# Patient Record
Sex: Male | Born: 1980 | Race: Black or African American | Hispanic: No | Marital: Single | Smoking: Never smoker
Health system: Southern US, Community
[De-identification: ages and names within clinical notes are randomized; demographics above are authoritative.]

## PROBLEM LIST (undated history)

## (undated) DIAGNOSIS — I10 Essential (primary) hypertension: Secondary | ICD-10-CM

## (undated) DIAGNOSIS — E119 Type 2 diabetes mellitus without complications: Secondary | ICD-10-CM

---

## 2009-10-17 ENCOUNTER — Emergency Department (HOSPITAL_COMMUNITY): Admission: EM | Admit: 2009-10-17 | Discharge: 2009-10-17 | Payer: Self-pay | Admitting: Emergency Medicine

## 2009-11-15 ENCOUNTER — Emergency Department (HOSPITAL_COMMUNITY): Admission: EM | Admit: 2009-11-15 | Discharge: 2009-11-15 | Payer: Self-pay | Admitting: Emergency Medicine

## 2010-12-31 IMAGING — CR DG CHEST 2V
2 series · 2 of 2 positions shown · non-contrast
Comparison: None.

CLINICAL DATA: Cough, congestion and abdominal pain.

CHEST - 2 VIEW

[view not recorded (1 of 2)]
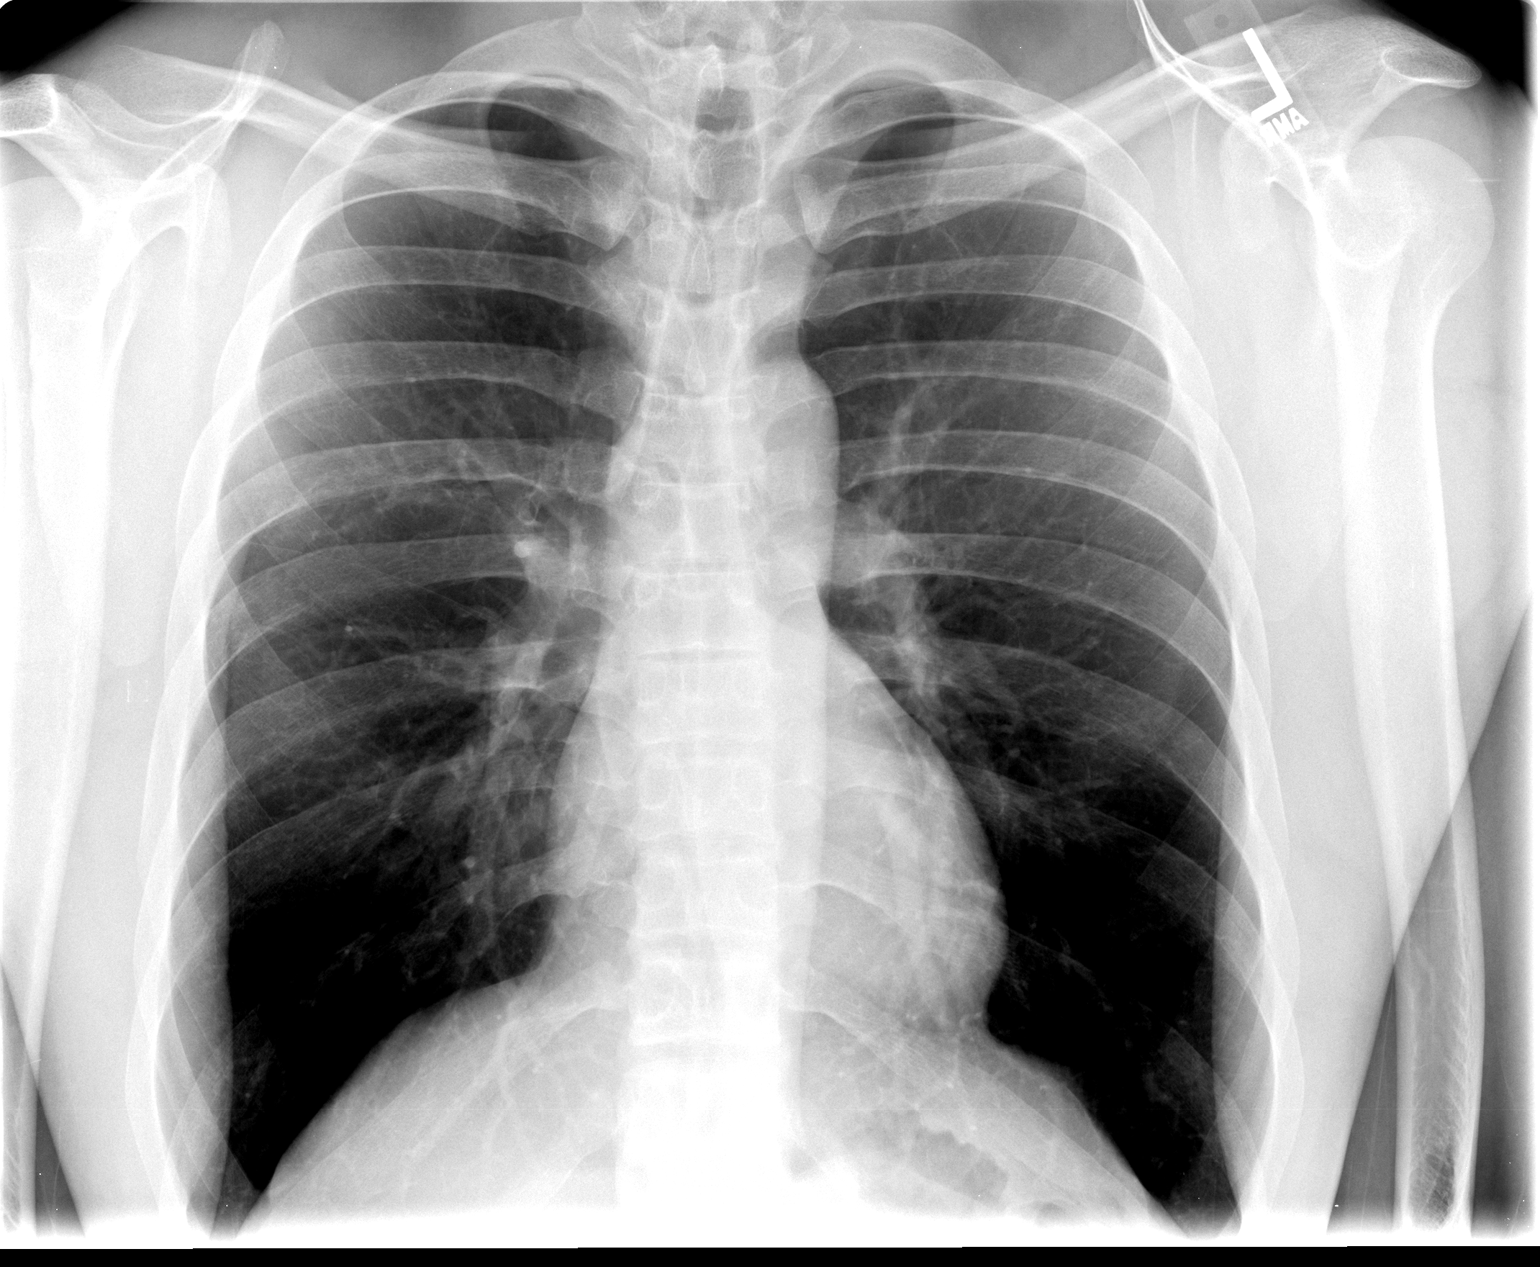

[view not recorded (2 of 2)]
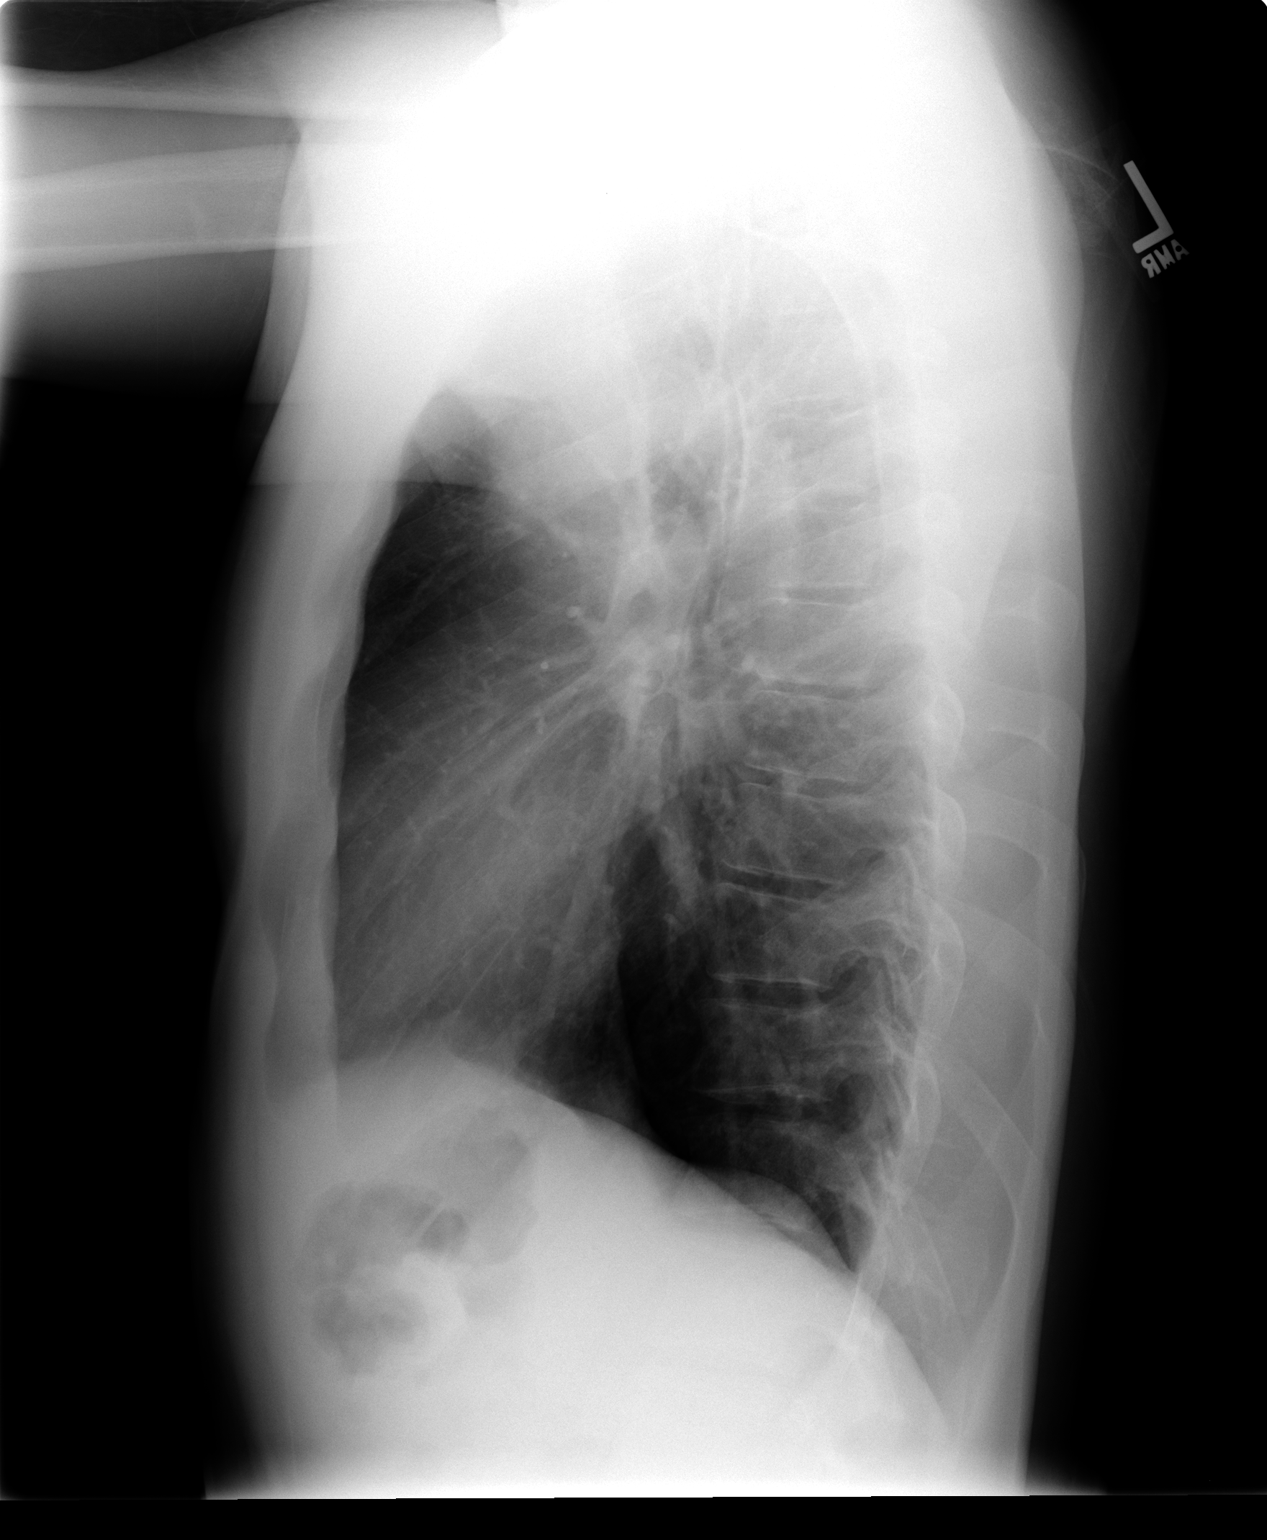

[2 of 2 positions shown; findings below may reference images not displayed]

FINDINGS: Trachea is midline.  Heart size normal.  Lungs are clear.
No pleural fluid.
IMPRESSION: No acute findings.

## 2011-12-19 ENCOUNTER — Emergency Department (HOSPITAL_COMMUNITY)
Admission: EM | Admit: 2011-12-19 | Discharge: 2011-12-19 | Disposition: A | Discharge status: Home / Self Care | Payer: Self-pay | Attending: Emergency Medicine | Admitting: Emergency Medicine

## 2011-12-19 ENCOUNTER — Encounter (HOSPITAL_COMMUNITY): Payer: Self-pay | Admitting: *Deleted

## 2011-12-19 DIAGNOSIS — K089 Disorder of teeth and supporting structures, unspecified: Secondary | ICD-10-CM | POA: Insufficient documentation

## 2011-12-19 DIAGNOSIS — K0889 Other specified disorders of teeth and supporting structures: Secondary | ICD-10-CM

## 2011-12-19 DIAGNOSIS — K029 Dental caries, unspecified: Secondary | ICD-10-CM | POA: Insufficient documentation

## 2011-12-19 DIAGNOSIS — I10 Essential (primary) hypertension: Secondary | ICD-10-CM | POA: Insufficient documentation

## 2011-12-19 HISTORY — DX: Essential (primary) hypertension: I10

## 2011-12-19 MED ORDER — HYDROCODONE-ACETAMINOPHEN 5-325 MG PO TABS
1.0000 | ORAL_TABLET | Freq: Once | ORAL | Status: AC
  Administered 2011-12-19: 1 via ORAL
  Filled 2011-12-19: qty 1

## 2011-12-19 MED ORDER — IBUPROFEN 800 MG PO TABS
800.0000 mg | ORAL_TABLET | Freq: Once | ORAL | Status: AC
  Administered 2011-12-19: 800 mg via ORAL
  Filled 2011-12-19: qty 1

## 2011-12-19 MED ORDER — HYDROCODONE-ACETAMINOPHEN 5-325 MG PO TABS: ORAL_TABLET | ORAL | Status: DC

## 2011-12-19 MED ORDER — CLINDAMYCIN HCL 150 MG PO CAPS: 300.0000 mg | ORAL_CAPSULE | Freq: Three times a day (TID) | ORAL | Status: AC

## 2011-12-19 MED ORDER — CLINDAMYCIN HCL 150 MG PO CAPS
300.0000 mg | ORAL_CAPSULE | Freq: Once | ORAL | Status: AC
  Administered 2011-12-19: 300 mg via ORAL
  Filled 2011-12-19: qty 2

## 2011-12-19 NOTE — ED Provider Notes (Signed)
History     CSN: [id number]  Arrival date & time 12/19/11  1055   First MD Initiated Contact with Patient 12/19/11 1105      Chief Complaint  Patient presents with  . Dental Pain    (Consider location/radiation/quality/duration/timing/severity/associated sxs/prior treatment) HPI Comments: Plans to go to see his girlfriend's dentist for extraction.  Patient is a 31 y.o. male presenting with tooth pain. The history is provided by the patient. No language interpreter was used.  Dental PainThe primary symptoms include mouth pain. Primary symptoms do not include dental injury or fever. The symptoms began 2 days ago. The symptoms are unchanged. The symptoms occur constantly.  Additional symptoms include: jaw pain. Additional symptoms do not include: facial swelling.    Past Medical History  Diagnosis Date  . Hypertension     History reviewed. No pertinent past surgical history.  No family history on file.  History  Substance Use Topics  . Smoking status: Never Smoker   . Smokeless tobacco: Not on file  . Alcohol Use: No      Review of Systems  Constitutional: Negative for fever.  HENT: Positive for dental problem. Negative for facial swelling.   All other systems reviewed and are negative.    Allergies  Penicillins  Home Medications  No current outpatient prescriptions on file.  BP 148/100  Pulse 96  Temp 98.7 F (37.1 C)  Resp 18  Ht 5\' 6"  (1.676 m)  Wt 168 lb (76.204 kg)  BMI 27.12 kg/m2  SpO2 99%  Physical Exam  Nursing note and vitals reviewed. Constitutional: He is oriented to person, place, and time. He appears well-developed and well-nourished.  HENT:  Head: Normocephalic and atraumatic.  Mouth/Throat: Uvula is midline and mucous membranes are normal. Normal dentition. Dental caries present. No dental abscesses or uvula swelling. No oropharyngeal exudate, posterior oropharyngeal edema, posterior oropharyngeal erythema or tonsillar abscesses.     Eyes: EOM are normal.  Neck: Normal range of motion.  Cardiovascular: Normal rate, regular rhythm, normal heart sounds and intact distal pulses.   Pulmonary/Chest: Effort normal and breath sounds normal. No respiratory distress.  Abdominal: Soft. He exhibits no distension. There is no tenderness.  Musculoskeletal: Normal range of motion.  Neurological: He is alert and oriented to person, place, and time.  Skin: Skin is warm and dry.  Psychiatric: He has a normal mood and affect. Judgment normal.    ED Course  Procedures (including critical care time)  Labs Reviewed - No data to display No results found.   No diagnosis found.    MDM          Worthy Rancher, PA 12/19/11 [phone]

## 2011-12-19 NOTE — Discharge Instructions (Signed)
Dental Pain Toothache is pain in or around a tooth. It may get worse with chewing or with cold or heat.  HOME CARE  Your dentist may use a numbing medicine during treatment. If so, you may need to avoid eating until the medicine wears off. Ask your dentist about this.   Only take medicine as told by your dentist or doctor.   Avoid chewing food near the painful tooth until after all treatment is done. Ask your dentist about this.  GET HELP RIGHT AWAY IF:   The problem gets worse or new problems appear.   You have a fever.   There is redness and puffiness (swelling) of the face, jaw, or neck.   You cannot open your mouth.   There is pain in the jaw.   There is very bad pain that is not helped by medicine.  MAKE SURE YOU:   Understand these instructions.   Will watch your condition.   Will get help right away if you are not doing well or get worse.  Document Released: 04/07/2008 Document Revised: 07/02/2011 Document Reviewed: 04/07/2008 [facility] Patient Information 2012 [place], [place].   Take the meds as directed.  Take ibuprofen up to 800 mg every 8 hrs with food.  Follow up with the dentist as planned.

## 2011-12-19 NOTE — ED Notes (Signed)
Pt given resource handouts for free clinic and dental resource sheet for [employer]

## 2011-12-19 NOTE — ED Notes (Signed)
Pt also states that he wants to be checked to see if he has asthma, needs referral to pcp,

## 2011-12-19 NOTE — ED Notes (Signed)
Left lower dental pain, swelling to gum area, started 4-5 days ago,

## 2011-12-20 NOTE — ED Provider Notes (Signed)
Evaluation and management procedures were performed by the PA/NP under my supervision/collaboration.   Temica Righetti D Azan Maneri, MD 12/20/11 0820 

## 2015-07-16 ENCOUNTER — Encounter (HOSPITAL_COMMUNITY): Payer: Self-pay | Admitting: Emergency Medicine

## 2015-07-16 ENCOUNTER — Emergency Department (HOSPITAL_COMMUNITY)
Admission: EM | Admit: 2015-07-16 | Discharge: 2015-07-16 | Disposition: A | Discharge status: Home / Self Care | Payer: Self-pay | Attending: Emergency Medicine | Admitting: Emergency Medicine

## 2015-07-16 DIAGNOSIS — I10 Essential (primary) hypertension: Secondary | ICD-10-CM | POA: Insufficient documentation

## 2015-07-16 DIAGNOSIS — K0889 Other specified disorders of teeth and supporting structures: Secondary | ICD-10-CM

## 2015-07-16 DIAGNOSIS — K088 Other specified disorders of teeth and supporting structures: Secondary | ICD-10-CM | POA: Insufficient documentation

## 2015-07-16 DIAGNOSIS — K029 Dental caries, unspecified: Secondary | ICD-10-CM | POA: Insufficient documentation

## 2015-07-16 DIAGNOSIS — Z880 Allergy status to penicillin: Secondary | ICD-10-CM | POA: Insufficient documentation

## 2015-07-16 MED ORDER — CLINDAMYCIN HCL 150 MG PO CAPS: 300.0000 mg | ORAL_CAPSULE | Freq: Four times a day (QID) | ORAL | Status: DC

## 2015-07-16 MED ORDER — TRAMADOL HCL 50 MG PO TABS
50.0000 mg | ORAL_TABLET | Freq: Once | ORAL | Status: AC
  Administered 2015-07-16: 50 mg via ORAL
  Filled 2015-07-16: qty 1

## 2015-07-16 MED ORDER — CLINDAMYCIN HCL 150 MG PO CAPS
300.0000 mg | ORAL_CAPSULE | Freq: Once | ORAL | Status: AC
  Administered 2015-07-16: 300 mg via ORAL
  Filled 2015-07-16: qty 2

## 2015-07-16 MED ORDER — TRAMADOL HCL 50 MG PO TABS: 50.0000 mg | ORAL_TABLET | Freq: Four times a day (QID) | ORAL | Status: DC | PRN

## 2015-07-16 NOTE — ED Notes (Signed)
Patient given discharge instruction, verbalized understand. Patient ambulatory out of the department.  

## 2015-07-16 NOTE — Discharge Instructions (Signed)
Dental Pain  Toothache is pain in or around a tooth. It may get worse with chewing or with cold or heat.   HOME CARE  · Your dentist may use a numbing medicine during treatment. If so, you may need to avoid eating until the medicine wears off. Ask your dentist about this.  · Only take medicine as told by your dentist or doctor.  · Avoid chewing food near the painful tooth until after all treatment is done. Ask your dentist about this.  GET HELP RIGHT AWAY IF:   · The problem gets worse or new problems appear.  · You have a fever.  · There is redness and puffiness (swelling) of the face, jaw, or neck.  · You cannot open your mouth.  · There is pain in the jaw.  · There is very bad pain that is not helped by medicine.  MAKE SURE YOU:   · Understand these instructions.  · Will watch your condition.  · Will get help right away if you are not doing well or get worse.  Document Released: 04/07/2008 Document Revised: 01/12/2012 Document Reviewed: 04/07/2008  [employer]® Patient Information ©2015 [employer]. This information is not intended to replace advice given to you by your health care provider. Make sure you discuss any questions you have with your health care provider.

## 2015-07-16 NOTE — ED Notes (Signed)
Started hurting lower lt jaw last week - pt states that gum is red and inflammed and he has some swelling to his jaw.-

## 2015-07-16 NOTE — ED Provider Notes (Signed)
CSN: [id number]     Arrival date & time 07/16/15  1458 History   First MD Initiated Contact with Patient 07/16/15 1621     Chief Complaint  Patient presents with  . Oral Swelling     (Consider location/radiation/quality/duration/timing/severity/associated sxs/prior Treatment) HPI  Edward Roman is a 34 y.o. male who presents to the Emergency Department complaining of left lower tooth pain for one week.  He describes a sharp throbbing pain to his left lower jaw that radiates to the left ear and face.  Pain is worse with cold exposure and chewing.  He also c/o redness and swelling along the gums of the left lower molar.  He has tried OTC analgesics without relief.  He denies difficulty swallowing, neck pain, fever and facial swelling.  He has not tried to arrange a dental visit.     Past Medical History  Diagnosis Date  . Hypertension    History reviewed. No pertinent past surgical history. Family History  Problem Relation Age of Onset  . Hypertension Mother   . Diabetes Mother   . Hypertension Father   . Diabetes Father   . Hypertension Other   . Diabetes Other   . Thyroid disease Other    Social History  Substance Use Topics  . Smoking status: Never Smoker   . Smokeless tobacco: None  . Alcohol Use: No    Review of Systems  Constitutional: Negative for fever and appetite change.  HENT: Positive for dental problem. Negative for congestion, facial swelling, sore throat and trouble swallowing.   Eyes: Negative for pain and visual disturbance.  Musculoskeletal: Negative for neck pain and neck stiffness.  Neurological: Negative for dizziness, facial asymmetry and headaches.  Hematological: Negative for adenopathy.  All other systems reviewed and are negative.     Allergies  Penicillins  Home Medications   Prior to Admission medications   Medication Sig Start Date End Date Taking? Authorizing Provider  HYDROcodone-acetaminophen (NORCO) 5-325 MG per tablet One tab  po q 4-6 hrs prn pain 12/19/11   Richard Paul Half, PA-C   BP 161/98 mmHg  Pulse 68  Temp(Src) 98.1 F (36.7 C) (Oral)  Resp 16  Ht  (1.626 m)  Wt 177 lb (80.287 kg)  BMI 30.37 kg/m2  SpO2 100% Physical Exam  Constitutional: He is oriented to person, place, and time. He appears well-developed and well-nourished. No distress.  HENT:  Head: Normocephalic and atraumatic.  Right Ear: Tympanic membrane and ear canal normal.  Left Ear: Tympanic membrane and ear canal normal.  Mouth/Throat: Uvula is midline, oropharynx is clear and moist and mucous membranes are normal. No trismus in the jaw. Dental caries present. No dental abscesses or uvula swelling.  Tenderness of the left lower third molar with edema and erythema of the surrounding gums.  No facial swelling, obvious dental abscess, trismus, or sublingual abnml.    Neck: Normal range of motion. Neck supple.  Cardiovascular: Normal rate, regular rhythm and normal heart sounds.   No murmur heard. Pulmonary/Chest: Effort normal and breath sounds normal.  Musculoskeletal: Normal range of motion.  Lymphadenopathy:    He has no cervical adenopathy.  Neurological: He is alert and oriented to person, place, and time. He exhibits normal muscle tone. Coordination normal.  Skin: Skin is warm and dry.  Nursing note and vitals reviewed.   ED Course  Procedures (including critical care time) Labs Review Labs Reviewed - No data to display    MDM   Final diagnoses:  Pain, dental    Pt is well appearing.  Vitals stable , no concerning sx's for Ludwig's angina. Airway is patent.  Pt agrees to close f/u with a dentist.  Referral info given.  Patient stable for d/c and agrees to plan.    Pauline Aus, PA-C 07/17/15 1351  Devoria Albe, MD 07/17/15 2302

## 2016-10-02 ENCOUNTER — Ambulatory Visit: Payer: Self-pay | Admitting: Pharmacy Technician

## 2016-10-02 NOTE — Progress Notes (Signed)
Patient scheduled for eligibility appointment at [facility].  Patient did not show for the appointment on 10/02/16 at 10:30a.m.  Patient did not reschedule eligibility appointment.  Sherilyn DacostaBetty J; Anyelina Claycomb Care Manager [facility]

## 2018-02-18 ENCOUNTER — Encounter: Payer: Self-pay | Admitting: Emergency Medicine

## 2018-02-18 ENCOUNTER — Other Ambulatory Visit: Payer: Self-pay

## 2018-02-18 ENCOUNTER — Emergency Department
Admission: EM | Admit: 2018-02-18 | Discharge: 2018-02-18 | Disposition: A | Discharge status: Home / Self Care | Payer: Self-pay | Attending: Emergency Medicine | Admitting: Emergency Medicine

## 2018-02-18 ENCOUNTER — Emergency Department: Payer: Self-pay

## 2018-02-18 DIAGNOSIS — X500XXA Overexertion from strenuous movement or load, initial encounter: Secondary | ICD-10-CM | POA: Insufficient documentation

## 2018-02-18 DIAGNOSIS — Y999 Unspecified external cause status: Secondary | ICD-10-CM | POA: Insufficient documentation

## 2018-02-18 DIAGNOSIS — I10 Essential (primary) hypertension: Secondary | ICD-10-CM | POA: Insufficient documentation

## 2018-02-18 DIAGNOSIS — S39011A Strain of muscle, fascia and tendon of abdomen, initial encounter: Secondary | ICD-10-CM | POA: Insufficient documentation

## 2018-02-18 DIAGNOSIS — Y9383 Activity, rough housing and horseplay: Secondary | ICD-10-CM | POA: Insufficient documentation

## 2018-02-18 DIAGNOSIS — Y929 Unspecified place or not applicable: Secondary | ICD-10-CM | POA: Insufficient documentation

## 2018-02-18 DIAGNOSIS — Z79899 Other long term (current) drug therapy: Secondary | ICD-10-CM | POA: Insufficient documentation

## 2018-02-18 MED ORDER — CYCLOBENZAPRINE HCL 10 MG PO TABS: 10.0000 mg | ORAL_TABLET | Freq: Three times a day (TID) | ORAL | 0 refills | Status: DC | PRN

## 2018-02-18 MED ORDER — IBUPROFEN 600 MG PO TABS: 600.0000 mg | ORAL_TABLET | Freq: Three times a day (TID) | ORAL | 0 refills | Status: DC | PRN

## 2018-02-18 NOTE — ED Provider Notes (Signed)
[facility] Emergency Department Provider Note   ____________________________________________   First MD Initiated Contact with Patient 02/18/18 1130     (approximate)  I have reviewed the triage vital signs and the nursing notes.   HISTORY  Chief Complaint Cough    HPI Edward Roman is a 37 y.o. male patient complain left upper abdominal pain which increased with coughing.  Patient states there is a history of increased exertion wrestling with his children in the past week.  Patient state pain in the abdomen increased with coughing.  Patient denies nausea, vomiting, diarrhea.  Patient denies constipation or bloody stools.  Patient denies flank pain.  Patient denies urinary complaints.  Past Medical History:  Diagnosis Date  . Hypertension     There are no active problems to display for this patient.   History reviewed. No pertinent surgical history.  Prior to Admission medications   Medication Sig Start Date End Date Taking? Authorizing Provider  clindamycin (CLEOCIN) 150 MG capsule Take 2 capsules (300 mg total) by mouth 4 (four) times daily. For 7 days 07/16/15   Triplett, Tammy, PA-C  cyclobenzaprine (FLEXERIL) 10 MG tablet Take 1 tablet (10 mg total) by mouth 3 (three) times daily as needed. 02/18/18   Joni Reining, PA-C  HYDROcodone-acetaminophen [facility]) 5-325 MG per tablet One tab po q 4-6 hrs prn pain 12/19/11   Worthy Rancher, PA-C  ibuprofen (ADVIL,MOTRIN) 600 MG tablet Take 1 tablet (600 mg total) by mouth every 8 (eight) hours as needed. 02/18/18   Joni Reining, PA-C  traMADol (ULTRAM) 50 MG tablet Take 1 tablet (50 mg total) by mouth every 6 (six) hours as needed. 07/16/15   Triplett, Tammy, PA-C    Allergies Penicillins  Family History  Problem Relation Age of Onset  . Hypertension Mother   . Diabetes Mother   . Hypertension Father   . Diabetes Father   . Hypertension Other   . Diabetes Other   . Thyroid disease Other       Social History Social History   Tobacco Use  . Smoking status: Never Smoker  . Smokeless tobacco: Never Used  Substance Use Topics  . Alcohol use: No  . Drug use: No    Review of Systems Constitutional: No fever/chills Eyes: No visual changes. ENT: No sore throat. Cardiovascular: Denies chest pain. Respiratory: Denies shortness of breath. Gastrointestinal: No abdominal pain.  No nausea, no vomiting.  No diarrhea.  No constipation. Genitourinary: Negative for dysuria. Musculoskeletal: Negative for back pain. Skin: Negative for rash. Neurological: Negative for headaches, focal weakness or numbness.   ____________________________________________   PHYSICAL EXAM:  VITAL SIGNS: ED Triage Vitals [02/18/18 1128]  Enc Vitals Group     BP      Pulse      Resp      Temp      Temp src      SpO2      Weight 177 lb (80.3 kg)     Height 5\' 6"  (1.676 m)     Head Circumference      Peak Flow      Pain Score 6     Pain Loc      Pain Edu?      Excl. in GC?     Constitutional: Alert and oriented. Well appearing and in no acute distress. Cardiovascular: Normal rate, regular rhythm. Grossly normal heart sounds.  Good peripheral circulation. Respiratory: Normal respiratory effort.  No retractions. Lungs CTAB.  Gastrointestinal: Soft, but ,moderate guarding with palpation.   No distention.  Abdominal bruits. No CVA tenderness. Skin:  Skin is warm, dry and intact. No rash noted. Psychiatric: Mood and affect are normal. Speech and behavior are normal.  ____________________________________________   LABS (all labs ordered are listed, but only abnormal results are displayed)  Labs Reviewed - No data to display ____________________________________________  EKG   ____________________________________________  RADIOLOGY    Official radiology report(s): [place] Abdomen Limited  Result Date: 02/18/2018 CLINICAL DATA:  Left upper abdominal pain EXAM: ULTRASOUND ABDOMEN  LIMITED: SPLEEN COMPARISON:  None. FINDINGS: Spleen appears normal in size and contour. Splenic length is 9.0 cm. No perisplenic fluid or adenopathy. IMPRESSION: Normal appearing spleen by ultrasound. Electronically Signed   By: Bretta BangWilliam  Woodruff III M.D.   On: 02/18/2018 12:24    ____________________________________________   PROCEDURES  Procedure(s) performed: None  Procedures  Critical Care performed: No  ____________________________________________   INITIAL IMPRESSION / ASSESSMENT AND PLAN / ED COURSE  As part of my medical decision making, I reviewed the following data within the electronic MEDICAL RECORD NUMBER    Left upper and mid abdominal pain.  Differential includes renal calculus,   splenic contusion, or muscle strain.  Patient ultrasound was unremarkable.  Patient given discharge care instructions and advised take medication as directed.  [facility]     ____________________________________________   FINAL CLINICAL IMPRESSION(S) / ED DIAGNOSES  Final diagnoses:  Abdominal muscle strain, initial encounter     ED Discharge Orders        Ordered    cyclobenzaprine (FLEXERIL) 10 MG tablet  3 times daily PRN     02/18/18 1250    ibuprofen (ADVIL,MOTRIN) 600 MG tablet  Every 8 hours PRN     02/18/18 1250       Note:  This document was prepared using Dragon voice recognition software and may include unintentional dictation errors.    Joni ReiningSmith, Marykathryn Carboni K, PA-C 02/18/18 1302    Darci CurrentBrown, Amanda N, MD 02/18/18 [phone]

## 2018-02-18 NOTE — ED Triage Notes (Signed)
When pt coughs feels a pain in left abdomen .  Had viral like illness couple weeks ago.

## 2018-05-20 ENCOUNTER — Encounter (HOSPITAL_COMMUNITY): Payer: Self-pay | Admitting: Emergency Medicine

## 2018-05-20 ENCOUNTER — Other Ambulatory Visit: Payer: Self-pay

## 2018-05-20 ENCOUNTER — Emergency Department (HOSPITAL_COMMUNITY)
Admission: EM | Admit: 2018-05-20 | Discharge: 2018-05-20 | Disposition: A | Discharge status: Home / Self Care | Payer: Self-pay | Attending: Emergency Medicine | Admitting: Emergency Medicine

## 2018-05-20 DIAGNOSIS — I10 Essential (primary) hypertension: Secondary | ICD-10-CM | POA: Insufficient documentation

## 2018-05-20 DIAGNOSIS — J019 Acute sinusitis, unspecified: Secondary | ICD-10-CM | POA: Insufficient documentation

## 2018-05-20 DIAGNOSIS — J329 Chronic sinusitis, unspecified: Secondary | ICD-10-CM

## 2018-05-20 DIAGNOSIS — Z79899 Other long term (current) drug therapy: Secondary | ICD-10-CM | POA: Insufficient documentation

## 2018-05-20 MED ORDER — HYDROCHLOROTHIAZIDE 25 MG PO TABS: 25.0000 mg | ORAL_TABLET | Freq: Every day | ORAL | 1 refills | Status: DC

## 2018-05-20 MED ORDER — DOXYCYCLINE HYCLATE 100 MG PO CAPS: 100.0000 mg | ORAL_CAPSULE | Freq: Two times a day (BID) | ORAL | 0 refills | Status: DC

## 2018-05-20 NOTE — ED Triage Notes (Signed)
Nasal congestion, cough since Monday

## 2018-05-20 NOTE — ED Notes (Signed)
Per PA Sofia, PA-C patientt's BP does not need to be rechecked. Had not taken his BP med.

## 2018-05-20 NOTE — Discharge Instructions (Signed)
Schedule appointment for follow up on elevated blood pressure

## 2018-05-20 NOTE — ED Provider Notes (Signed)
[facility] EMERGENCY DEPARTMENT Provider Note   CSN: [id number] Arrival date & time: 05/20/18  1715     History   Chief Complaint Chief Complaint  Patient presents with  . Facial Pain    HPI Edward Roman is a 37 y.o. male.  The history is provided by the patient. No language interpreter was used.  Cough  This is a new problem. The current episode started more than 1 week ago. The problem occurs constantly. The problem has not changed since onset.The cough is non-productive. There has been no fever. Associated symptoms include rhinorrhea. He has tried nothing for the symptoms. The treatment provided no relief. He is not a smoker. Past medical history comments: sinus infections.    Past Medical History:  Diagnosis Date  . Hypertension     There are no active problems to display for this patient.   History reviewed. No pertinent surgical history.      Home Medications    Prior to Admission medications   Medication Sig Start Date End Date Taking? Authorizing Provider  clindamycin (CLEOCIN) 150 MG capsule Take 2 capsules (300 mg total) by mouth 4 (four) times daily. For 7 days 07/16/15   Triplett, Tammy, PA-C  cyclobenzaprine (FLEXERIL) 10 MG tablet Take 1 tablet (10 mg total) by mouth 3 (three) times daily as needed. 02/18/18   Joni Reining, PA-C  doxycycline (VIBRAMYCIN) 100 MG capsule Take 1 capsule (100 mg total) by mouth 2 (two) times daily. 05/20/18   Elson Areas, PA-C  hydrochlorothiazide (HYDRODIURIL) 25 MG tablet Take 1 tablet (25 mg total) by mouth daily. 05/20/18   Elson Areas, PA-C  HYDROcodone-acetaminophen [facility]) 5-325 MG per tablet One tab po q 4-6 hrs prn pain 12/19/11   Worthy Rancher, PA-C  ibuprofen (ADVIL,MOTRIN) 600 MG tablet Take 1 tablet (600 mg total) by mouth every 8 (eight) hours as needed. 02/18/18   Joni Reining, PA-C  traMADol (ULTRAM) 50 MG tablet Take 1 tablet (50 mg total) by mouth every 6 (six) hours as needed. 07/16/15    Pauline Aus, PA-C    Family History Family History  Problem Relation Age of Onset  . Hypertension Mother   . Diabetes Mother   . Hypertension Father   . Diabetes Father   . Hypertension Other   . Diabetes Other   . Thyroid disease Other     Social History Social History   Tobacco Use  . Smoking status: Never Smoker  . Smokeless tobacco: Never Used  Substance Use Topics  . Alcohol use: No  . Drug use: No     Allergies   Penicillins   Review of Systems Review of Systems  HENT: Positive for rhinorrhea, sinus pressure and sinus pain.   Respiratory: Positive for cough.   All other systems reviewed and are negative.    Physical Exam Updated Vital Signs BP (!) 178/99 (BP Location: Right Arm)   Pulse 86   Temp 98.5 F (36.9 C) (Temporal)   Resp 18   Ht 5\' 4"  (1.626 m)   Wt 80.3 kg (177 lb)   SpO2 100%   BMI 30.38 kg/m   Physical Exam  Constitutional: He is oriented to person, place, and time. He appears well-developed and well-nourished.  HENT:  Head: Normocephalic.  Right Ear: External ear normal.  Left Ear: External ear normal.  Tender maxillary sinuses,   Eyes: EOM are normal.  Neck: Normal range of motion.  Cardiovascular: Normal rate.  Pulmonary/Chest: Effort  normal.  Abdominal: He exhibits no distension.  Musculoskeletal: Normal range of motion.  Neurological: He is alert and oriented to person, place, and time.  Skin: Skin is warm.  Psychiatric: He has a normal mood and affect.  Nursing note and vitals reviewed.    ED Treatments / Results  Labs (all labs ordered are listed, but only abnormal results are displayed) Labs Reviewed - No data to display  EKG None  Radiology No results found.  Procedures Procedures (including critical care time)  Medications Ordered in ED Medications - No data to display   Initial Impression / Assessment and Plan / ED Course  I have reviewed the triage vital signs and the nursing  notes.  Pertinent labs & imaging results that were available during my care of the patient were reviewed by me and considered in my medical decision making (see chart for details).     Pt is out of his blood pressure medications.  Pt given rx for hctz.  Pt given referral for blood pressure management  Final Clinical Impressions(s) / ED Diagnoses   Final diagnoses:  Sinusitis, unspecified chronicity, unspecified location    ED Discharge Orders        Ordered    hydrochlorothiazide (HYDRODIURIL) 25 MG tablet  Daily     05/20/18 1813    doxycycline (VIBRAMYCIN) 100 MG capsule  2 times daily     05/20/18 1813    An After Visit Summary was printed and given to the patient.   Osie CheeksSofia, Leslie K, PA-C 05/20/18 2322    Benjiman CorePickering, Nathan, MD 05/20/18 [phone]

## 2018-06-22 ENCOUNTER — Other Ambulatory Visit: Payer: Self-pay

## 2018-06-22 ENCOUNTER — Encounter (HOSPITAL_COMMUNITY): Payer: Self-pay | Admitting: Emergency Medicine

## 2018-06-22 ENCOUNTER — Emergency Department (HOSPITAL_COMMUNITY)
Admission: EM | Admit: 2018-06-22 | Discharge: 2018-06-22 | Disposition: A | Discharge status: Home / Self Care | Payer: Self-pay | Attending: Emergency Medicine | Admitting: Emergency Medicine

## 2018-06-22 DIAGNOSIS — K0889 Other specified disorders of teeth and supporting structures: Secondary | ICD-10-CM | POA: Insufficient documentation

## 2018-06-22 DIAGNOSIS — I10 Essential (primary) hypertension: Secondary | ICD-10-CM | POA: Insufficient documentation

## 2018-06-22 DIAGNOSIS — Z79899 Other long term (current) drug therapy: Secondary | ICD-10-CM | POA: Insufficient documentation

## 2018-06-22 MED ORDER — TRAMADOL HCL 50 MG PO TABS: 50.0000 mg | ORAL_TABLET | Freq: Four times a day (QID) | ORAL | 0 refills | Status: DC | PRN

## 2018-06-22 MED ORDER — IBUPROFEN 800 MG PO TABS
800.0000 mg | ORAL_TABLET | Freq: Once | ORAL | Status: AC
  Administered 2018-06-22: 800 mg via ORAL
  Filled 2018-06-22: qty 1

## 2018-06-22 MED ORDER — CLINDAMYCIN HCL 300 MG PO CAPS: 300.0000 mg | ORAL_CAPSULE | Freq: Three times a day (TID) | ORAL | 0 refills | Status: DC

## 2018-06-22 MED ORDER — IBUPROFEN 800 MG PO TABS: 800.0000 mg | ORAL_TABLET | Freq: Three times a day (TID) | ORAL | 0 refills | Status: DC

## 2018-06-22 MED ORDER — HYDROCODONE-ACETAMINOPHEN 5-325 MG PO TABS
1.0000 | ORAL_TABLET | Freq: Once | ORAL | Status: AC
  Administered 2018-06-22: 1 via ORAL
  Filled 2018-06-22: qty 1

## 2018-06-22 NOTE — ED Provider Notes (Signed)
[facility] EMERGENCY DEPARTMENT Provider Note   CSN: [id number] Arrival date & time: 06/22/18  1637     History   Chief Complaint Chief Complaint  Patient presents with  . Dental Pain    HPI Edward Roman is a 37 y.o. male who presents with dental pain. PMH significant for HTN, hx of dental caries. He states that he's had dental pain on and off for years and had to have teeth pulled. Over the past couple days he's had worsening dental pain over the left upper molars. He feels like he has some swelling in the area and has difficulty eating due to pain. He denies fever. He does not have a Education officer, communitydentist.  HPI  Past Medical History:  Diagnosis Date  . Hypertension     There are no active problems to display for this patient.   History reviewed. No pertinent surgical history.      Home Medications    Prior to Admission medications   Medication Sig Start Date End Date Taking? Authorizing Provider  cyclobenzaprine (FLEXERIL) 10 MG tablet Take 1 tablet (10 mg total) by mouth 3 (three) times daily as needed. 02/18/18   Joni ReiningSmith, Ronald K, PA-C  doxycycline (VIBRAMYCIN) 100 MG capsule Take 1 capsule (100 mg total) by mouth 2 (two) times daily. 05/20/18   Elson AreasSofia, Leslie K, PA-C  hydrochlorothiazide (HYDRODIURIL) 25 MG tablet Take 1 tablet (25 mg total) by mouth daily. 05/20/18   Elson AreasSofia, Leslie K, PA-C  ibuprofen (ADVIL,MOTRIN) 600 MG tablet Take 1 tablet (600 mg total) by mouth every 8 (eight) hours as needed. 02/18/18   Joni ReiningSmith, Ronald K, PA-C    Family History Family History  Problem Relation Age of Onset  . Hypertension Mother   . Diabetes Mother   . Hypertension Father   . Diabetes Father   . Hypertension Other   . Diabetes Other   . Thyroid disease Other     Social History Social History   Tobacco Use  . Smoking status: Never Smoker  . Smokeless tobacco: Never Used  Substance Use Topics  . Alcohol use: No  . Drug use: No     Allergies   Penicillins   Review of  Systems Review of Systems  Constitutional: Negative for fever.  HENT: Positive for dental problem and facial swelling.      Physical Exam Updated Vital Signs BP (!) 141/100 (BP Location: Right Arm)   Pulse 98   Temp 98.3 F (36.8 C) (Temporal)   Resp 16   Ht 5\' 7"  (1.702 m)   Wt 83 kg   SpO2 97%   BMI 28.66 kg/m   Physical Exam  Constitutional: He is oriented to person, place, and time. He appears well-developed and well-nourished. No distress.  HENT:  Head: Normocephalic and atraumatic.  Mouth/Throat: Uvula is midline, oropharynx is clear and moist and mucous membranes are normal. Abnormal dentition. Dental caries (Severely decayed left upper molars) present.  No appreciable facial swelling  Eyes: Pupils are equal, round, and reactive to light. Conjunctivae are normal. Right eye exhibits no discharge. Left eye exhibits no discharge. No scleral icterus.  Neck: Normal range of motion.  Cardiovascular: Normal rate.  Pulmonary/Chest: Effort normal. No respiratory distress.  Abdominal: He exhibits no distension.  Neurological: He is alert and oriented to person, place, and time.  Skin: Skin is warm and dry.  Psychiatric: He has a normal mood and affect. His behavior is normal.  Nursing note and vitals reviewed.    ED  Treatments / Results  Labs (all labs ordered are listed, but only abnormal results are displayed) Labs Reviewed - No data to display  EKG None  Radiology No results found.  Procedures Procedures (including critical care time)  Medications Ordered in ED Medications  ibuprofen (ADVIL,MOTRIN) tablet 800 mg (800 mg Oral Given 06/22/18 1716)  HYDROcodone-acetaminophen (NORCO/VICODIN) 5-325 MG per tablet 1 tablet (1 tablet Oral Given 06/22/18 1716)     Initial Impression / Assessment and Plan / ED Course  I have reviewed the triage vital signs and the nursing notes.  Pertinent labs & imaging results that were available during my care of the patient were  reviewed by me and considered in my medical decision making (see chart for details).  Dental pain associated with dental caries and possible dental infection. Patient is afebrile, non toxic appearing, and swallowing secretions well. I gave patient referral to dentist and stressed the importance of dental follow up for ultimate management of dental pain. I have also discussed reasons to return immediately to the ER.  He was given rx for antibiotics and Ibuprofen and Tramadol. Patient expresses understanding and agrees with plan.   Final Clinical Impressions(s) / ED Diagnoses   Final diagnoses:  Pain, dental    ED Discharge Orders    None       Bethel BornGekas, Breckin Zafar Marie, PA-C 06/22/18 1717    Donnetta Hutchingook, Brian, MD 06/24/18 [phone]

## 2018-06-22 NOTE — Discharge Instructions (Signed)
Please take Ibuprofen 800mg  three times a day for pain and inflammation Take Clindamycin three times a day (antibiotic) Take Tramadol as needed for moderate-severe pain Follow up with a dentist - a resource guide has been provided

## 2018-06-22 NOTE — ED Triage Notes (Signed)
Pt c/o R upper dental pain for several months. Has not seen a dentist in years.

## 2018-10-08 ENCOUNTER — Emergency Department (HOSPITAL_COMMUNITY)
Admission: EM | Admit: 2018-10-08 | Discharge: 2018-10-08 | Disposition: A | Discharge status: Home / Self Care | Payer: Self-pay | Attending: Emergency Medicine | Admitting: Emergency Medicine

## 2018-10-08 ENCOUNTER — Other Ambulatory Visit: Payer: Self-pay

## 2018-10-08 ENCOUNTER — Encounter (HOSPITAL_COMMUNITY): Payer: Self-pay

## 2018-10-08 DIAGNOSIS — Y939 Activity, unspecified: Secondary | ICD-10-CM | POA: Insufficient documentation

## 2018-10-08 DIAGNOSIS — X58XXXA Exposure to other specified factors, initial encounter: Secondary | ICD-10-CM | POA: Insufficient documentation

## 2018-10-08 DIAGNOSIS — Z79899 Other long term (current) drug therapy: Secondary | ICD-10-CM | POA: Insufficient documentation

## 2018-10-08 DIAGNOSIS — Y929 Unspecified place or not applicable: Secondary | ICD-10-CM | POA: Insufficient documentation

## 2018-10-08 DIAGNOSIS — I10 Essential (primary) hypertension: Secondary | ICD-10-CM | POA: Insufficient documentation

## 2018-10-08 DIAGNOSIS — Y999 Unspecified external cause status: Secondary | ICD-10-CM | POA: Insufficient documentation

## 2018-10-08 DIAGNOSIS — S61212A Laceration without foreign body of right middle finger without damage to nail, initial encounter: Secondary | ICD-10-CM | POA: Insufficient documentation

## 2018-10-08 DIAGNOSIS — Z23 Encounter for immunization: Secondary | ICD-10-CM | POA: Insufficient documentation

## 2018-10-08 DIAGNOSIS — S61213A Laceration without foreign body of left middle finger without damage to nail, initial encounter: Secondary | ICD-10-CM

## 2018-10-08 MED ORDER — HYDROCHLOROTHIAZIDE 25 MG PO TABS: 25.0000 mg | ORAL_TABLET | Freq: Every day | ORAL | 0 refills | Status: DC

## 2018-10-08 MED ORDER — TRAMADOL HCL 50 MG PO TABS: ORAL_TABLET | ORAL | 0 refills | Status: DC

## 2018-10-08 MED ORDER — BACITRACIN-NEOMYCIN-POLYMYXIN 400-5-5000 EX OINT
TOPICAL_OINTMENT | CUTANEOUS | Status: AC
  Filled 2018-10-08: qty 1

## 2018-10-08 MED ORDER — IBUPROFEN 800 MG PO TABS
800.0000 mg | ORAL_TABLET | Freq: Once | ORAL | Status: AC
  Administered 2018-10-08: 800 mg via ORAL
  Filled 2018-10-08: qty 1

## 2018-10-08 MED ORDER — LIDOCAINE HCL (PF) 1 % IJ SOLN
5.0000 mL | Freq: Once | INTRAMUSCULAR | Status: DC
  Filled 2018-10-08: qty 6

## 2018-10-08 MED ORDER — ACETAMINOPHEN 500 MG PO TABS
1000.0000 mg | ORAL_TABLET | Freq: Once | ORAL | Status: AC
  Administered 2018-10-08: 1000 mg via ORAL
  Filled 2018-10-08: qty 2

## 2018-10-08 MED ORDER — TETANUS-DIPHTH-ACELL PERTUSSIS 5-2.5-18.5 LF-MCG/0.5 IM SUSP
INTRAMUSCULAR | Status: AC
  Administered 2018-10-08: 0.5 mL via INTRAMUSCULAR
  Filled 2018-10-08: qty 0.5

## 2018-10-08 NOTE — ED Triage Notes (Signed)
Pt has a laceration to the right middle finger. Bleeding controlled

## 2018-10-08 NOTE — ED Provider Notes (Signed)
[facility] EMERGENCY DEPARTMENT Provider Note   CSN: [id number] Arrival date & time: 10/08/18  1251     History   Chief Complaint Chief Complaint  Patient presents with  . Laceration    HPI Edward Roman is a 37 y.o. male.  The history is provided by the patient.  Laceration   The incident occurred 1 to 2 hours ago. The laceration is located on the right hand. Size: 2.1. The pain is mild. The pain has been constant since onset. He reports no foreign bodies present. His tetanus status is out of date.    Past Medical History:  Diagnosis Date  . Hypertension     There are no active problems to display for this patient.   History reviewed. No pertinent surgical history.      Home Medications    Prior to Admission medications   Medication Sig Start Date End Date Taking? Authorizing Provider  clindamycin (CLEOCIN) 300 MG capsule Take 1 capsule (300 mg total) by mouth 3 (three) times daily. 06/22/18   Bethel Born, PA-C  cyclobenzaprine (FLEXERIL) 10 MG tablet Take 1 tablet (10 mg total) by mouth 3 (three) times daily as needed. 02/18/18   Joni Reining, PA-C  doxycycline (VIBRAMYCIN) 100 MG capsule Take 1 capsule (100 mg total) by mouth 2 (two) times daily. 05/20/18   Elson Areas, PA-C  hydrochlorothiazide (HYDRODIURIL) 25 MG tablet Take 1 tablet (25 mg total) by mouth daily. 05/20/18   Elson Areas, PA-C  ibuprofen (ADVIL,MOTRIN) 800 MG tablet Take 1 tablet (800 mg total) by mouth 3 (three) times daily. 06/22/18   Bethel Born, PA-C  traMADol (ULTRAM) 50 MG tablet Take 1 tablet (50 mg total) by mouth every 6 (six) hours as needed. 06/22/18   Bethel Born, PA-C    Family History Family History  Problem Relation Age of Onset  . Hypertension Mother   . Diabetes Mother   . Hypertension Father   . Diabetes Father   . Hypertension Other   . Diabetes Other   . Thyroid disease Other     Social History Social History   Tobacco Use  . Smoking  status: Never Smoker  . Smokeless tobacco: Never Used  Substance Use Topics  . Alcohol use: No  . Drug use: No     Allergies   Penicillins   Review of Systems Review of Systems  Constitutional: Negative for activity change.       All ROS Neg except as noted in HPI  HENT: Negative for nosebleeds.   Eyes: Negative for photophobia and discharge.  Respiratory: Negative for cough, shortness of breath and wheezing.   Cardiovascular: Negative for chest pain and palpitations.  Gastrointestinal: Negative for abdominal pain and blood in stool.  Genitourinary: Negative for dysuria, frequency and hematuria.  Musculoskeletal: Negative for arthralgias, back pain and neck pain.  Skin: Negative.   Neurological: Negative for dizziness, seizures and speech difficulty.  Psychiatric/Behavioral: Negative for confusion and hallucinations.     Physical Exam Updated Vital Signs BP (!) 156/112 (BP Location: Left Arm) Comment: Does not take his BP meds  Pulse (!) 101   Temp 98.2 F (36.8 C) (Oral)   Resp 14   Ht 5\' 4"  (1.626 m)   Wt 80.3 kg   SpO2 100%   BMI 30.38 kg/m   Physical Exam  Constitutional: He is oriented to person, place, and time. He appears well-developed and well-nourished.  Non-toxic appearance.  HENT:  Head:  Normocephalic.  Right Ear: Tympanic membrane and external ear normal.  Left Ear: Tympanic membrane and external ear normal.  Eyes: Pupils are equal, round, and reactive to light. EOM and lids are normal.  Neck: Normal range of motion. Neck supple. Carotid bruit is not present.  Cardiovascular: Normal rate, regular rhythm, normal heart sounds, intact distal pulses and normal pulses.  Pulmonary/Chest: Breath sounds normal. No respiratory distress.  Abdominal: Soft. Bowel sounds are normal. There is no tenderness. There is no guarding.  Musculoskeletal: Normal range of motion.       Right hand: He exhibits tenderness and laceration. He exhibits normal range of motion.        Hands: Lymphadenopathy:       Head (right side): No submandibular adenopathy present.       Head (left side): No submandibular adenopathy present.    He has no cervical adenopathy.  Neurological: He is alert and oriented to person, place, and time. He has normal strength. No cranial nerve deficit or sensory deficit.  Skin: Skin is warm and dry.  Psychiatric: He has a normal mood and affect. His speech is normal.  Nursing note and vitals reviewed.    ED Treatments / Results  Labs (all labs ordered are listed, but only abnormal results are displayed) Labs Reviewed - No data to display  EKG None  Radiology No results found.  Procedures Procedures (including critical care time)  Medications Ordered in ED Medications - No data to display   Initial Impression / Assessment and Plan / ED Course  I have reviewed the triage vital signs and the nursing notes.  Pertinent labs & imaging results that were available during my care of the patient were reviewed by me and considered in my medical decision making (see chart for details).      Final Clinical Impressions(s) / ED Diagnoses MDM  Patient sustained a laceration to the dorsal middle finger of the right hand.  The wound was repaired with 6 interrupted sutures of 4-0 nylon. No bone or tendon involvement.  Patient's tetanus was updated.  Patient to have the sutures removed in 7 or 8 days.  He is to return sooner if any signs of advancing infection.   Final diagnoses:  Laceration of left middle finger without foreign body without damage to nail, initial encounter  Essential hypertension    ED Discharge Orders         Ordered    hydrochlorothiazide (HYDRODIURIL) 25 MG tablet  Daily     10/08/18 1508    traMADol (ULTRAM) 50 MG tablet     10/08/18 1508           Ivery QualeBryant, Jacqueline Delapena, PA-C 10/09/18 1658    Samuel JesterMcManus, Kathleen, DO 10/10/18 [phone]

## 2018-10-08 NOTE — Discharge Instructions (Signed)
Please keep the wound clean and dry.  Please change the bandage daily.  Use Tylenol, and/or ibuprofen for mild pain, use Ultram for more severe pain.This medication may cause drowsiness. Please do not drink, drive, or participate in activity that requires concentration while taking this medication.  Please have the sutures removed in 7 or 8 days.  See your doctor or return to the emergency department if any signs of advancing infection.

## 2019-08-15 ENCOUNTER — Emergency Department
Admission: EM | Admit: 2019-08-15 | Discharge: 2019-08-15 | Disposition: A | Discharge status: Home / Self Care | Payer: Self-pay | Attending: Emergency Medicine | Admitting: Emergency Medicine

## 2019-08-15 ENCOUNTER — Other Ambulatory Visit: Payer: Self-pay

## 2019-08-15 DIAGNOSIS — R12 Heartburn: Secondary | ICD-10-CM | POA: Insufficient documentation

## 2019-08-15 DIAGNOSIS — R7303 Prediabetes: Secondary | ICD-10-CM

## 2019-08-15 DIAGNOSIS — I10 Essential (primary) hypertension: Secondary | ICD-10-CM

## 2019-08-15 DIAGNOSIS — Z79899 Other long term (current) drug therapy: Secondary | ICD-10-CM | POA: Insufficient documentation

## 2019-08-15 LAB — CBC
HCT: 40.2 % (ref 39.0–52.0)
Hemoglobin: 14.2 g/dL (ref 13.0–17.0)
MCH: 30.4 pg (ref 26.0–34.0)
MCHC: 35.3 g/dL (ref 30.0–36.0)
MCV: 86.1 fL (ref 80.0–100.0)
Platelets: 329 10*3/uL (ref 150–400)
RBC: 4.67 MIL/uL (ref 4.22–5.81)
RDW: 12.1 % (ref 11.5–15.5)
WBC: 8 10*3/uL (ref 4.0–10.5)
nRBC: 0 % (ref 0.0–0.2)

## 2019-08-15 LAB — BASIC METABOLIC PANEL
Anion gap: 10 (ref 5–15)
BUN: 9 mg/dL (ref 6–20)
CO2: 27 mmol/L (ref 22–32)
Calcium: 9.5 mg/dL (ref 8.9–10.3)
Chloride: 99 mmol/L (ref 98–111)
Creatinine, Ser: 1 mg/dL (ref 0.61–1.24)
GFR calc Af Amer: >60 mL/min (ref >60)
GFR calc non Af Amer: >60 mL/min (ref >60)
Glucose, Bld: 227 mg/dL — ABNORMAL HIGH (ref 70–99)
Potassium: 3.8 mmol/L (ref 3.5–5.1)
Sodium: 136 mmol/L (ref 135–145)

## 2019-08-15 LAB — URINALYSIS, COMPLETE (UACMP) WITH MICROSCOPIC
Bacteria, UA: NONE SEEN
Bilirubin Urine: NEGATIVE
Glucose, UA: 50 mg/dL — AB
Hgb urine dipstick: NEGATIVE
Ketones, ur: NEGATIVE mg/dL
Leukocytes,Ua: NEGATIVE
Nitrite: NEGATIVE
Protein, ur: 30 mg/dL — AB
Specific Gravity, Urine: 1.019 (ref 1.005–1.030)
Squamous Epithelial / LPF: NONE SEEN (ref 0–5)
pH: 5 (ref 5.0–8.0)

## 2019-08-15 MED ORDER — PANTOPRAZOLE SODIUM 20 MG PO TBEC: 20.0000 mg | DELAYED_RELEASE_TABLET | Freq: Every day | ORAL | 1 refills | Status: DC

## 2019-08-15 MED ORDER — BLOOD GLUCOSE MONITOR KIT: PACK | 0 refills | Status: DC

## 2019-08-15 MED ORDER — AMLODIPINE BESYLATE 5 MG PO TABS: 5.0000 mg | ORAL_TABLET | Freq: Every day | ORAL | 2 refills | Status: DC

## 2019-08-15 NOTE — ED Triage Notes (Signed)
First Nurse Note:  Arrives stating he wishes to have blood pressure checked.    AAOx3.  Skin warm and dry. NAD

## 2019-08-15 NOTE — ED Provider Notes (Signed)
[facility] Emergency Department Provider Note  Time seen: 7:56 PM  I have reviewed the triage vital signs and the nursing notes.   HISTORY  Chief Complaint Hypertension   HPI Edward Roman is a 38 y.o. male with a past medical history of hypertension presents to the emergency department with complaints of possible hypertension.  According to the patient he supposed to be on blood pressure medications but stopped taking them greater than 1 year ago.  States over the past several weeks he has been having mild headaches and pressure behind his eye states he had similar symptoms in the past with high blood pressure.  Also states he has been experiencing heartburn times many months but is not taking anything.  Does not currently have a primary care doctor.  He also states his daughter is post have a dental procedure done in the next 2 weeks and needs her to get a COVID test and wants to know where he can take her.  Patient also states he came hopefully to get established with a PCP in the area.  Denies any fever cough or shortness of breath.  Past Medical History:  Diagnosis Date  . Hypertension     There are no active problems to display for this patient.   History reviewed. No pertinent surgical history.  Prior to Admission medications   Medication Sig Start Date End Date Taking? Authorizing Provider  clindamycin (CLEOCIN) 300 MG capsule Take 1 capsule (300 mg total) by mouth 3 (three) times daily. 06/22/18   Gekas, Kelly Marie, PA-C  cyclobenzaprine (FLEXERIL) 10 MG tablet Take 1 tablet (10 mg total) by mouth 3 (three) times daily as needed. 02/18/18   Edward, Ronald K, PA-C  doxycycline (VIBRAMYCIN) 100 MG capsule Take 1 capsule (100 mg total) by mouth 2 (two) times daily. 05/20/18   Sofia, Leslie K, PA-C  hydrochlorothiazide (HYDRODIURIL) 25 MG tablet Take 1 tablet (25 mg total) by mouth daily. 10/08/18   Bryant, Hobson, PA-C  ibuprofen (ADVIL,MOTRIN) 800 MG  tablet Take 1 tablet (800 mg total) by mouth 3 (three) times daily. 06/22/18   Gekas, Kelly Marie, PA-C  traMADol (ULTRAM) 50 MG tablet 1 or 2 po q6h prn pain 10/08/18   Bryant, Hobson, PA-C    Allergies  Allergen Reactions  . Penicillins     Family History  Problem Relation Age of Onset  . Hypertension Mother   . Diabetes Mother   . Hypertension Father   . Diabetes Father   . Hypertension Other   . Diabetes Other   . Thyroid disease Other     Social History Social History   Tobacco Use  . Smoking status: Never Smoker  . Smokeless tobacco: Never Used  Substance Use Topics  . Alcohol use: No  . Drug use: No    Review of Systems Constitutional: Negative for fever. Cardiovascular: Negative for chest pain. Respiratory: Negative for shortness of breath.  Negative for cough. Gastrointestinal: Negative for abdominal pain Musculoskeletal: Negative for musculoskeletal complaints Skin: Negative for skin complaints  Neurological: Mild headache, denies any currently. All other ROS negative  ____________________________________________   PHYSICAL EXAM:  VITAL SIGNS: ED Triage Vitals  Enc Vitals Group     BP 08/15/19 1842 (!) 166/102     Pulse Rate 08/15/19 1842 96     Resp 08/15/19 1842 19     Temp 08/15/19 1842 98.8 F (37.1 C)     Temp src --      SpO2   08/15/19 1842 95 %     Weight 08/15/19 1843 180 lb (81.6 kg)     Height 08/15/19 1843 5' 4" (1.626 m)     Head Circumference --      Peak Flow --      Pain Score 08/15/19 1843 0     Pain Loc --      Pain Edu? --      Excl. in [place]? --    Constitutional: Alert and oriented. Well appearing and in no distress. Eyes: Normal exam ENT      Head: Normocephalic and atraumatic.      Mouth/Throat: Mucous membranes are moist. Cardiovascular: Normal rate, regular rhythm. Respiratory: Normal respiratory effort without tachypnea nor retractions. Breath sounds are clear Gastrointestinal: Soft and nontender. No distention.    Musculoskeletal: Nontender with normal range of motion in all extremities.  Neurologic:  Normal speech and language. No gross focal neurologic deficits  Skin:  Skin is warm, dry and intact.  Psychiatric: Mood and affect are normal.   INITIAL IMPRESSION / ASSESSMENT AND PLAN / ED COURSE  Pertinent labs & imaging results that were available during my care of the patient were reviewed by me and considered in my medical decision making (see chart for details).   Patient presents emergency department for high blood pressure.  States has been elevated for some time stopped taking medications greater than 1 year ago.  States he has been experiencing intermittent mild headaches which he got last time he had high blood pressure.  Patient's blood pressure currently 166/102.  Denies any chest pain or shortness of breath cough or fever.  Patient also states for many months he has been having gastric reflux especially at night and is wondering if we could prescribe him something for that.  Also wishes to be established with a PCP.  He is also asking if we can test his daughter for COVID as she has a dental procedure coming up in 2 weeks.  Patient's work-up today is overall reassuring.  Does have mild hyperglycemia however the patient states he drank something less than an hour prior to his blood draw.  I still discussed likelihood of borderline diabetes and the need to watch his diet and follow-up with the primary care doctor.  We will also prescribe a glucose monitoring kit for the patient to use at home and to record his blood sugars especially first thing in the morning.  We will place the patient on amlodipine 5 mg daily.  We will place the patient on Protonix 20 mg daily.  We will refer to a primary care doctor.  I will also provide referral information for the patient's daughter [facility] and [facility].  Overall patient appears extremely well, reassuring physical exam overall reassuring labs  and vitals.  Edward Roman was evaluated in Emergency Department on 08/15/2019 for the symptoms described in the history of present illness. He was evaluated in the context of the global COVID-19 pandemic, which necessitated consideration that the patient might be at risk for infection with the SARS-CoV-2 virus that causes COVID-19. Institutional protocols and algorithms that pertain to the evaluation of patients at risk for COVID-19 are in a state of rapid change based on information released by regulatory bodies including the CDC and federal and state organizations. These policies and algorithms were followed during the patient's care in the ED.  ____________________________________________   FINAL CLINICAL IMPRESSION(S) / ED DIAGNOSES  Hypertension Borderline diabetes   Harvest Dark, MD  08/15/19 2008

## 2019-08-15 NOTE — ED Triage Notes (Addendum)
Pt comes via POV with c/o hypertension. Pt states he feels like his BP is high. Pt states when he wakes up in the morning he feels pain behind his eye and throbbing.  Pt denies any blurred vision.  Pt states he used to take medication for his BP but stopped on his own.  Pt also states urinary frequency that is new.  Pt denies any pain or burning with urination.  Current BP-166/102.

## 2019-09-01 ENCOUNTER — Other Ambulatory Visit: Payer: Self-pay

## 2019-09-01 ENCOUNTER — Encounter: Payer: Self-pay | Admitting: Emergency Medicine

## 2019-09-01 ENCOUNTER — Emergency Department
Admission: EM | Admit: 2019-09-01 | Discharge: 2019-09-01 | Disposition: A | Discharge status: Home / Self Care | Payer: Self-pay | Attending: Emergency Medicine | Admitting: Emergency Medicine

## 2019-09-01 DIAGNOSIS — J302 Other seasonal allergic rhinitis: Secondary | ICD-10-CM

## 2019-09-01 DIAGNOSIS — M62838 Other muscle spasm: Secondary | ICD-10-CM

## 2019-09-01 DIAGNOSIS — Z79899 Other long term (current) drug therapy: Secondary | ICD-10-CM | POA: Insufficient documentation

## 2019-09-01 DIAGNOSIS — M542 Cervicalgia: Secondary | ICD-10-CM | POA: Insufficient documentation

## 2019-09-01 DIAGNOSIS — I10 Essential (primary) hypertension: Secondary | ICD-10-CM | POA: Insufficient documentation

## 2019-09-01 MED ORDER — OXYMETAZOLINE HCL 0.05 % NA SOLN
1.0000 | Freq: Once | NASAL | Status: AC
  Administered 2019-09-01: 1 via NASAL
  Filled 2019-09-01: qty 30

## 2019-09-01 MED ORDER — MELOXICAM 15 MG PO TABS: 15.0000 mg | ORAL_TABLET | Freq: Every day | ORAL | 1 refills | Status: AC

## 2019-09-01 MED ORDER — METHOCARBAMOL 500 MG PO TABS: 500.0000 mg | ORAL_TABLET | Freq: Three times a day (TID) | ORAL | 0 refills | Status: DC | PRN

## 2019-09-01 MED ORDER — KETOROLAC TROMETHAMINE 30 MG/ML IJ SOLN
30.0000 mg | Freq: Once | INTRAMUSCULAR | Status: AC
  Administered 2019-09-01: 30 mg via INTRAMUSCULAR
  Filled 2019-09-01: qty 1

## 2019-09-01 MED ORDER — CETIRIZINE HCL 10 MG PO TABS: 10.0000 mg | ORAL_TABLET | Freq: Every day | ORAL | 0 refills | Status: DC

## 2019-09-01 MED ORDER — MELOXICAM 15 MG PO TABS: 15.0000 mg | ORAL_TABLET | Freq: Every day | ORAL | 1 refills | Status: DC

## 2019-09-01 MED ORDER — METHOCARBAMOL 500 MG PO TABS: 500.0000 mg | ORAL_TABLET | Freq: Three times a day (TID) | ORAL | 0 refills | Status: AC | PRN

## 2019-09-01 MED ORDER — METHOCARBAMOL 500 MG PO TABS
1000.0000 mg | ORAL_TABLET | Freq: Once | ORAL | Status: AC
  Administered 2019-09-01: 1000 mg via ORAL
  Filled 2019-09-01: qty 2

## 2019-09-01 NOTE — ED Notes (Signed)
First RN Note: Pt presents to ED via POV with c/o intermittent "electric pain" that shoots up into his head. Pt c/o intermittent pain that is worse "like when I get in and out of the car". Pt denies any pain at this time. Pt with noted full ROM at this time. Pt with noted nasal congestion.

## 2019-09-01 NOTE — ED Provider Notes (Signed)
[facility] Emergency Department Provider Note  ____________________________________________  Time seen: Approximately 8:43 PM  I have reviewed the triage vital signs and the nursing notes.   HISTORY  Chief Complaint Torticollis    HPI Edward Roman is a 38 y.o. male presents to the emergency department with left-sided neck pain that is localized to the left upper trapezius.  Patient states that pain is worsened with movement and relieved with rest.  Patient states that symptoms have occurred for the past 2 to 3 days.  He denies chest pain, chest tightness, nausea, vomiting or abdominal pain.  He denies falls or mechanisms of trauma.  He also states that he has had significant nasal congestion and rhinorrhea.  No nonproductive cough or fever.        Past Medical History:  Diagnosis Date  . Hypertension     There are no active problems to display for this patient.   History reviewed. No pertinent surgical history.  Prior to Admission medications   Medication Sig Start Date End Date Taking? Authorizing Provider  amLODipine (NORVASC) 5 MG tablet Take 1 tablet (5 mg total) by mouth daily. 08/15/19 08/14/20  Harvest Dark, MD  blood glucose meter kit and supplies KIT Dispense based on patient and insurance preference. Use up to four times daily as directed. (FOR ICD-9 250.00, 250.01). 08/15/19   Harvest Dark, MD  clindamycin (CLEOCIN) 300 MG capsule Take 1 capsule (300 mg total) by mouth 3 (three) times daily. 06/22/18   Recardo Evangelist, PA-C  cyclobenzaprine (FLEXERIL) 10 MG tablet Take 1 tablet (10 mg total) by mouth 3 (three) times daily as needed. 02/18/18   Sable Feil, PA-C  doxycycline (VIBRAMYCIN) 100 MG capsule Take 1 capsule (100 mg total) by mouth 2 (two) times daily. 05/20/18   Fransico Meadow, PA-C  hydrochlorothiazide (HYDRODIURIL) 25 MG tablet Take 1 tablet (25 mg total) by mouth daily. 10/08/18   Lily Kocher, PA-C   ibuprofen (ADVIL,MOTRIN) 800 MG tablet Take 1 tablet (800 mg total) by mouth 3 (three) times daily. 06/22/18   Recardo Evangelist, PA-C  pantoprazole (PROTONIX) 20 MG tablet Take 1 tablet (20 mg total) by mouth daily. 08/15/19 10/14/19  Harvest Dark, MD  traMADol Veatrice Bourbon) 50 MG tablet 1 or 2 po q6h prn pain 10/08/18   Lily Kocher, PA-C    Allergies Penicillins  Family History  Problem Relation Age of Onset  . Hypertension Mother   . Diabetes Mother   . Hypertension Father   . Diabetes Father   . Hypertension Other   . Diabetes Other   . Thyroid disease Other     Social History Social History   Tobacco Use  . Smoking status: Never Smoker  . Smokeless tobacco: Never Used  Substance Use Topics  . Alcohol use: No  . Drug use: No     Review of Systems  Constitutional: No fever/chills Eyes: No visual changes. No discharge ENT: No upper respiratory complaints. Cardiovascular: no chest pain. Respiratory: no cough. No SOB. Gastrointestinal: No abdominal pain.  No nausea, no vomiting.  No diarrhea.  No constipation. Genitourinary: Negative for dysuria. No hematuria Musculoskeletal: Patient has left-sided neck pain. Skin: Negative for rash, abrasions, lacerations, ecchymosis. Neurological: Negative for headaches, focal weakness or numbness.   ____________________________________________   PHYSICAL EXAM:  VITAL SIGNS: ED Triage Vitals [09/01/19 1920]  Enc Vitals Group     BP (!) 155/113     Pulse Rate 87     Resp  20     Temp 99 F (37.2 C)     Temp Source Oral     SpO2 97 %     Weight 180 lb (81.6 kg)     Height 5' 4"  (1.626 m)     Head Circumference      Peak Flow      Pain Score 0     Pain Loc      Pain Edu?      Excl. in [place]?      Constitutional: Alert and oriented. Well appearing and in no acute distress. Eyes: Conjunctivae are normal. PERRL. EOMI. Head: Atraumatic. ENT: Neck: No stridor. No cervical spine tenderness to palpation.  Patient has  left-sided paraspinal muscle tenderness along the cervical spine. Cardiovascular: Normal rate, regular rhythm. Normal S1 and S2.  Good peripheral circulation. Respiratory: Normal respiratory effort without tachypnea or retractions. Lungs CTAB. Good air entry to the bases with no decreased or absent breath sounds. Gastrointestinal: Bowel sounds 4 quadrants. Soft and nontender to palpation. No guarding or rigidity. No palpable masses. No distention. No CVA tenderness. Musculoskeletal: Full range of motion to all extremities. No gross deformities appreciated. Neurologic:  Normal speech and language. No gross focal neurologic deficits are appreciated.  Skin:  Skin is warm, dry and intact. No rash noted. Psychiatric: Mood and affect are normal. Speech and behavior are normal. Patient exhibits appropriate insight and judgement.   ____________________________________________   LABS (all labs ordered are listed, but only abnormal results are displayed)  Labs Reviewed - No data to display ____________________________________________  EKG   ____________________________________________  RADIOLOGY   No results found.  ____________________________________________    PROCEDURES  Procedure(s) performed:    Procedures    Medications  ketorolac (TORADOL) 30 MG/ML injection 30 mg (has no administration in time range)  methocarbamol (ROBAXIN) tablet 1,000 mg (has no administration in time range)  oxymetazoline (AFRIN) 0.05 % nasal spray 1 spray (has no administration in time range)     ____________________________________________   INITIAL IMPRESSION / ASSESSMENT AND PLAN / ED COURSE  Pertinent labs & imaging results that were available during my care of the patient were reviewed by me and considered in my medical decision making (see chart for details).  Review of the [place] CSRS was performed in accordance of the [place] prior to dispensing any controlled drugs.            Assessment and plan Neck pain 38 year old male presents to the emergency department with left-sided neck pain that worsens with range of motion of the neck.  Patient was hypertensive at triage but vital signs are otherwise reassuring.  He had some tenderness to palpation along the left upper trapezius but no midline C-spine tenderness.  Patient reported that his discomfort improved significantly with Toradol and Robaxin administered in the emergency department.  His nasal congestion improved with Afrin given in the emergency department.  He was discharged with meloxicam, Robaxin and Zyrtec.  He was advised to follow-up with primary care as needed.  All patient questions were answered.   ____________________________________________  FINAL CLINICAL IMPRESSION(S) / ED DIAGNOSES  Final diagnoses:  None      NEW MEDICATIONS STARTED DURING THIS VISIT:  ED Discharge Orders    None          This chart was dictated using voice recognition software/Dragon. Despite best efforts to proofread, errors can occur which can change the meaning. Any change was purely unintentional.    Lannie Fields, PA-C 09/01/19  2124    Harvest Dark, MD 09/01/19 2230

## 2019-11-24 ENCOUNTER — Encounter: Payer: Self-pay | Admitting: Emergency Medicine

## 2019-11-24 ENCOUNTER — Other Ambulatory Visit: Payer: Self-pay

## 2019-11-24 ENCOUNTER — Emergency Department
Admission: EM | Admit: 2019-11-24 | Discharge: 2019-11-25 | Discharge status: Against Medical Advice | Payer: Self-pay | Attending: Emergency Medicine | Admitting: Emergency Medicine

## 2019-11-24 DIAGNOSIS — E1369 Other specified diabetes mellitus with other specified complication: Secondary | ICD-10-CM | POA: Insufficient documentation

## 2019-11-24 DIAGNOSIS — Z5320 Procedure and treatment not carried out because of patient's decision for unspecified reasons: Secondary | ICD-10-CM | POA: Insufficient documentation

## 2019-11-24 DIAGNOSIS — R739 Hyperglycemia, unspecified: Secondary | ICD-10-CM

## 2019-11-24 DIAGNOSIS — I10 Essential (primary) hypertension: Secondary | ICD-10-CM | POA: Insufficient documentation

## 2019-11-24 DIAGNOSIS — Z79899 Other long term (current) drug therapy: Secondary | ICD-10-CM | POA: Insufficient documentation

## 2019-11-24 LAB — URINALYSIS, COMPLETE (UACMP) WITH MICROSCOPIC
Bacteria, UA: NONE SEEN
Bilirubin Urine: NEGATIVE
Glucose, UA: >=500 mg/dL — AB
Hgb urine dipstick: NEGATIVE
Ketones, ur: NEGATIVE mg/dL
Leukocytes,Ua: NEGATIVE
Nitrite: NEGATIVE
Protein, ur: NEGATIVE mg/dL
Specific Gravity, Urine: 1.024 (ref 1.005–1.030)
Squamous Epithelial / HPF: NONE SEEN (ref 0–5)
pH: 5 (ref 5.0–8.0)

## 2019-11-24 LAB — GLUCOSE, CAPILLARY
Glucose-Capillary: >600 mg/dL (ref 70–99)
Glucose-Capillary: >600 mg/dL (ref 70–99)
Glucose-Capillary: >600 mg/dL (ref 70–99)
Glucose-Capillary: 474 mg/dL — ABNORMAL HIGH (ref 70–99)
Glucose-Capillary: 517 mg/dL (ref 70–99)
Glucose-Capillary: 392 mg/dL — ABNORMAL HIGH (ref 70–99)
Glucose-Capillary: 289 mg/dL — ABNORMAL HIGH (ref 70–99)
Glucose-Capillary: 222 mg/dL — ABNORMAL HIGH (ref 70–99)

## 2019-11-24 LAB — CBC
HCT: 42.3 % (ref 39.0–52.0)
Hemoglobin: 14.8 g/dL (ref 13.0–17.0)
MCH: 29.7 pg (ref 26.0–34.0)
MCHC: 35 g/dL (ref 30.0–36.0)
MCV: 84.9 fL (ref 80.0–100.0)
Platelets: 373 10*3/uL (ref 150–400)
RBC: 4.98 MIL/uL (ref 4.22–5.81)
RDW: 11.7 % (ref 11.5–15.5)
WBC: 12.5 10*3/uL — ABNORMAL HIGH (ref 4.0–10.5)
nRBC: 0 % (ref 0.0–0.2)

## 2019-11-24 LAB — BLOOD GAS, VENOUS
Acid-Base Excess: 2.8 mmol/L — ABNORMAL HIGH (ref 0.0–2.0)
Bicarbonate: 30.4 mmol/L — ABNORMAL HIGH (ref 20.0–28.0)
FIO2: 0.21
O2 Saturation: 20.2 %
Patient temperature: 37
pCO2, Ven: 59 mmHg (ref 44.0–60.0)
pH, Ven: 7.32 (ref 7.250–7.430)
pO2, Ven: <31 mmHg — CL (ref 32.0–45.0)

## 2019-11-24 LAB — BASIC METABOLIC PANEL
Anion gap: 16 — ABNORMAL HIGH (ref 5–15)
BUN: 28 mg/dL — ABNORMAL HIGH (ref 6–20)
CO2: 25 mmol/L (ref 22–32)
Calcium: 9.6 mg/dL (ref 8.9–10.3)
Chloride: 85 mmol/L — ABNORMAL LOW (ref 98–111)
Creatinine, Ser: 1.54 mg/dL — ABNORMAL HIGH (ref 0.61–1.24)
GFR calc Af Amer: >60 mL/min (ref >60)
GFR calc non Af Amer: 57 mL/min — ABNORMAL LOW (ref >60)
Glucose, Bld: 898 mg/dL (ref 70–99)
Potassium: 4 mmol/L (ref 3.5–5.1)
Sodium: 126 mmol/L — ABNORMAL LOW (ref 135–145)

## 2019-11-24 MED ORDER — METFORMIN HCL 500 MG PO TABS: 500.0000 mg | ORAL_TABLET | Freq: Two times a day (BID) | ORAL | 2 refills | Status: DC

## 2019-11-24 MED ORDER — DEXTROSE-NACL 5-0.45 % IV SOLN: INTRAVENOUS | Status: DC

## 2019-11-24 MED ORDER — BLOOD GLUCOSE MONITOR KIT: PACK | 0 refills | Status: DC

## 2019-11-24 MED ORDER — SODIUM CHLORIDE 0.9 % IV SOLN: INTRAVENOUS | Status: DC

## 2019-11-24 MED ORDER — DEXTROSE 50 % IV SOLN: 0.0000 mL | INTRAVENOUS | Status: DC | PRN

## 2019-11-24 MED ORDER — SODIUM CHLORIDE 0.9 % IV BOLUS
1000.0000 mL | Freq: Once | INTRAVENOUS | Status: AC
  Administered 2019-11-24: 1000 mL via INTRAVENOUS

## 2019-11-24 MED ORDER — INSULIN REGULAR(HUMAN) IN NACL 100-0.9 UT/100ML-% IV SOLN
INTRAVENOUS | Status: DC
  Administered 2019-11-24: 6.5 [IU]/h via INTRAVENOUS
  Filled 2019-11-24: qty 100

## 2019-11-24 NOTE — ED Provider Notes (Signed)
[facility] Emergency Department Provider Note  ____________________________________________   I have reviewed the triage vital signs and the nursing notes.   HISTORY  Chief Complaint Hyperglycemia   History limited by: Not Limited   HPI Edward Roman is a 39 y.o. male who presents to the emergency department today because of concerns for possible high blood sugar.  The patient states that about 1 month ago he was seen in the emergency department and told that his sugars were slightly elevated.  He does have a significant family history of diabetes.  He states he has noticed that he has had lost some weight loss as well as increased thirst and urination.  Patient denies any fevers, chest pain or shortness of breath.  Denies any personal history of diabetes.  Records reviewed. Per medical record review patient has a history of hypertension. ED visit 3 months ago with blood sugar of 227  Past Medical History:  Diagnosis Date  . Hypertension     There are no problems to display for this patient.   History reviewed. No pertinent surgical history.  Prior to Admission medications   Medication Sig Start Date End Date Taking? Authorizing Provider  amLODipine (NORVASC) 5 MG tablet Take 1 tablet (5 mg total) by mouth daily. 08/15/19 08/14/20  Harvest Dark, MD  blood glucose meter kit and supplies KIT Dispense based on patient and insurance preference. Use up to four times daily as directed. (FOR ICD-9 250.00, 250.01). 08/15/19   Harvest Dark, MD  cetirizine (ZYRTEC ALLERGY) 10 MG tablet Take 1 tablet (10 mg total) by mouth daily for 10 days. 09/01/19 09/11/19  Lannie Fields, PA-C  clindamycin (CLEOCIN) 300 MG capsule Take 1 capsule (300 mg total) by mouth 3 (three) times daily. 06/22/18   Recardo Evangelist, PA-C  cyclobenzaprine (FLEXERIL) 10 MG tablet Take 1 tablet (10 mg total) by mouth 3 (three) times daily as needed. 02/18/18   Sable Feil,  PA-C  doxycycline (VIBRAMYCIN) 100 MG capsule Take 1 capsule (100 mg total) by mouth 2 (two) times daily. 05/20/18   Fransico Meadow, PA-C  hydrochlorothiazide (HYDRODIURIL) 25 MG tablet Take 1 tablet (25 mg total) by mouth daily. 10/08/18   Lily Kocher, PA-C  ibuprofen (ADVIL,MOTRIN) 800 MG tablet Take 1 tablet (800 mg total) by mouth 3 (three) times daily. 06/22/18   Recardo Evangelist, PA-C  pantoprazole (PROTONIX) 20 MG tablet Take 1 tablet (20 mg total) by mouth daily. 08/15/19 10/14/19  Harvest Dark, MD  traMADol Veatrice Bourbon) 50 MG tablet 1 or 2 po q6h prn pain 10/08/18   Lily Kocher, PA-C    Allergies Penicillins  Family History  Problem Relation Age of Onset  . Hypertension Mother   . Diabetes Mother   . Hypertension Father   . Diabetes Father   . Hypertension Other   . Diabetes Other   . Thyroid disease Other     Social History Social History   Tobacco Use  . Smoking status: Never Smoker  . Smokeless tobacco: Never Used  Substance Use Topics  . Alcohol use: No  . Drug use: No    Review of Systems Constitutional: No fever/chills Eyes: No visual changes. ENT: No sore throat. Cardiovascular: Denies chest pain. Respiratory: Denies shortness of breath. Gastrointestinal: No abdominal pain.  Increased thirst.   Genitourinary: Positive for increased urination.  Musculoskeletal: Negative for back pain. Skin: Negative for rash. Neurological: Negative for headaches, focal weakness or numbness.  ____________________________________________   PHYSICAL  EXAM:  VITAL SIGNS: ED Triage Vitals  Enc Vitals Group     BP 11/24/19 1606 (!) 132/101     Pulse Rate 11/24/19 1606 (!) 128     Resp 11/24/19 1606 18     Temp 11/24/19 1607 98.1 F (36.7 C)     Temp Source 11/24/19 1606 Oral     SpO2 11/24/19 1606 99 %     Weight 11/24/19 1607 188 lb (85.3 kg)     Height 11/24/19 1607 '5\' 5"'$  (1.651 m)     Head Circumference --      Peak Flow --      Pain Score 11/24/19  1607 0   Constitutional: Alert and oriented.  Eyes: Conjunctivae are normal.  ENT      Head: Normocephalic and atraumatic.      Nose: No congestion/rhinnorhea.      Mouth/Throat: Mucous membranes are moist.      Neck: No stridor. Hematological/Lymphatic/Immunilogical: No cervical lymphadenopathy. Cardiovascular: Tachycardic, regular rhythm.  No murmurs, rubs, or gallops.  Respiratory: Normal respiratory effort without tachypnea nor retractions. Breath sounds are clear and equal bilaterally. No wheezes/rales/rhonchi. Gastrointestinal: Soft and non tender. No rebound. No guarding.  Genitourinary: Deferred Musculoskeletal: Normal range of motion in all extremities. No lower extremity edema. Neurologic:  Normal speech and language. No gross focal neurologic deficits are appreciated.  Skin:  Skin is warm, dry and intact. No rash noted. Psychiatric: Mood and affect are normal. Speech and behavior are normal. Patient exhibits appropriate insight and judgment.  ____________________________________________    LABS (pertinent positives/negatives)  BMP na 126, cl 85, k 4.0, glu 898, cr 1.54 CBC wbc 12.5, hgb 14.8, plt 373 UA clear, > 500, negative ketones VBG pH 7.32 ____________________________________________   EKG  None  ____________________________________________    RADIOLOGY  None  ____________________________________________   PROCEDURES  Procedures  CRITICAL CARE Performed by: Nance Pear   Total critical care time: 45 minutes  Critical care time was exclusive of separately billable procedures and treating other patients.  Critical care was necessary to treat or prevent imminent or life-threatening deterioration.  Critical care was time spent personally by me on the following activities: development of treatment plan with patient and/or surrogate as well as nursing, discussions with consultants, evaluation of patient's response to treatment, examination of  patient, obtaining history from patient or surrogate, ordering and performing treatments and interventions, ordering and review of laboratory studies, ordering and review of radiographic studies, pulse oximetry and re-evaluation of patient's condition.  ____________________________________________   INITIAL IMPRESSION / ASSESSMENT AND PLAN / ED COURSE  Pertinent labs & imaging results that were available during my care of the patient were reviewed by me and considered in my medical decision making (see chart for details).   Patient presented to the emergency department today because of concern for high blood sugar. Patient does have history of diabetes in his family. Initial blood sugar almost 900. Do not think patient is in DKA at this time, however given elevation did start patient on insulin drip. Discussed with patient that my recommendation would be for admission.  Did discuss with patient concern for possibly going into diabetic ketoacidosis and increased mortality with that diagnosis.  However the patient stated he did not want to stay overnight as he had to get back to his family.  He however was willing to stay in the emergency department for a number of hours to receive insulin and IV fluids.  Patient's blood sugar did improve on the  IV insulin.  It was then stopped so that his blood sugar could be reassessed to make sure he did not drop too far.  I did discuss with patient that we would give him prescription for Metformin.  Did discuss possible GI upset.  Discussed with patient importance of close glucose monitoring and follow-up.  ____________________________________________   FINAL CLINICAL IMPRESSION(S) / ED DIAGNOSES  Final diagnoses:  Other specified diabetes mellitus with other specified complication, without long-term current use of insulin ([place])  Hyperglycemia     Note: This dictation was prepared with Dragon dictation. Any transcriptional errors that result from this process  are unintentional     Nance Pear, MD 11/24/19 2334

## 2019-11-24 NOTE — Discharge Instructions (Addendum)
As we discussed it is very important that you now monitor your blood sugars.  You are diabetic and will need to follow-up with primary care to manage this.  If your sugars get high in the future they can cause very significant illness.  Additionally elevated blood sugars can cause chronic changes to your heart, kidneys, eyes and nerves. Please seek medical attention for any high fevers, chest pain, shortness of breath, change in behavior, persistent vomiting, bloody stool or any other new or concerning symptoms.

## 2019-11-24 NOTE — ED Triage Notes (Signed)
Pt here for diabetes check, states his blood pressure has been high recently as well. Denies complaints other than "I stay tired, my eyes get blurry sometimes, but I drink a whole lot of water." NAD. Pt is "wanting to just get a shot for his blood sugar and head back home."

## 2019-11-24 NOTE — ED Notes (Signed)
Date and time results received: 11/24/19 4:45 PM (use smartphrase ".now" to insert current time)  Test: Glucose Critical Value: 898  Name of Provider Notified: Dr. Lenard Lance  Orders Received? Or Actions Taken?: acknowledged

## 2019-11-25 LAB — HEMOGLOBIN A1C
Hgb A1c MFr Bld: 10.3 % — ABNORMAL HIGH (ref 4.8–5.6)
Mean Plasma Glucose: 248.91 mg/dL

## 2019-11-25 LAB — GLUCOSE, CAPILLARY: Glucose-Capillary: 220 mg/dL — ABNORMAL HIGH (ref 70–99)

## 2019-11-25 MED ORDER — AMLODIPINE BESYLATE 5 MG PO TABS: 5.0000 mg | ORAL_TABLET | Freq: Every day | ORAL | 3 refills | Status: DC

## 2019-12-08 IMAGING — US US ABDOMEN LIMITED
1 series · 14 of 19 positions shown · non-contrast
Comparison: None.

CLINICAL DATA: Left upper abdominal pain

EXAM:
ULTRASOUND ABDOMEN LIMITED: SPLEEN

[Series 1: us abdomen limited · 0.23mm/px · 14 of 19 slices shown]
[im 1/19]
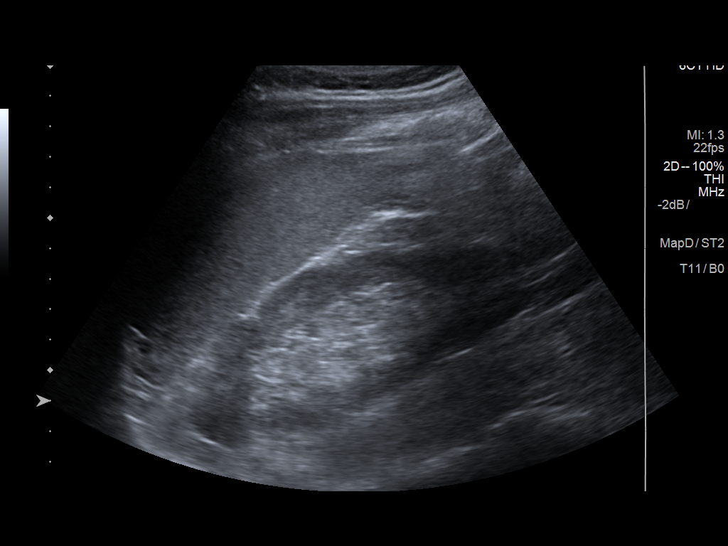
[im 3/19]
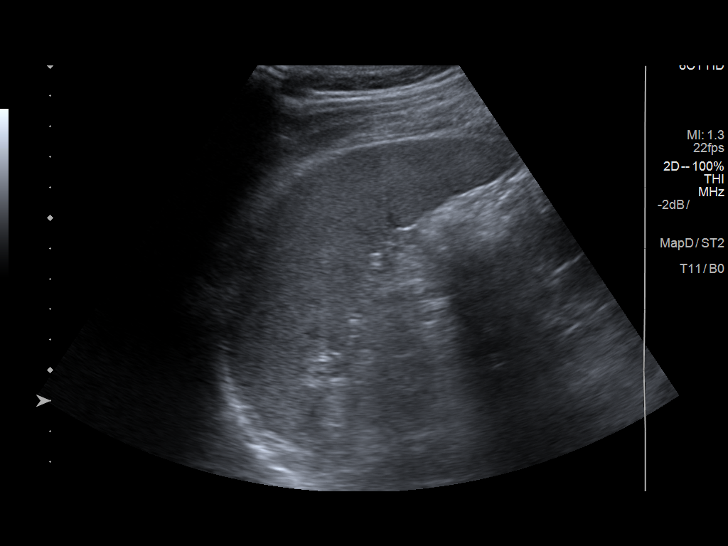
[im 4/19]
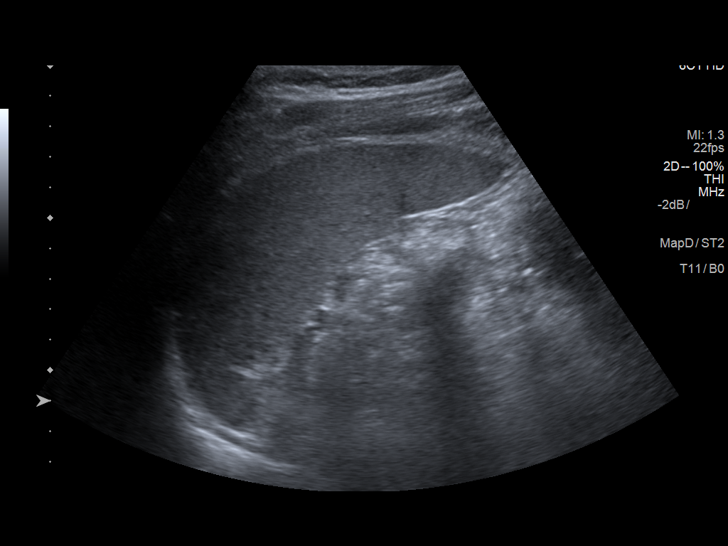
[im 5/19]
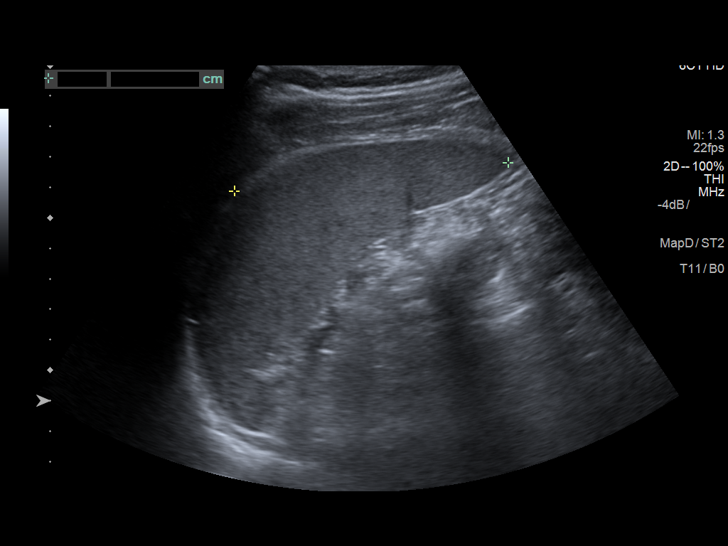
[im 7/19]
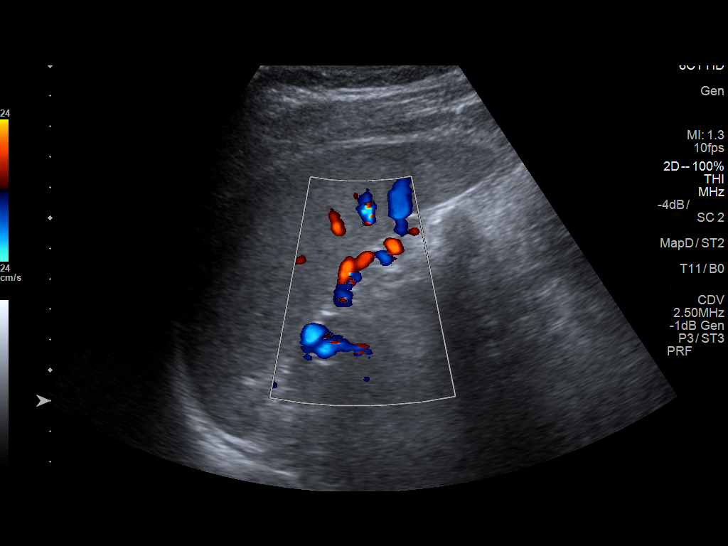
[im 8/19]
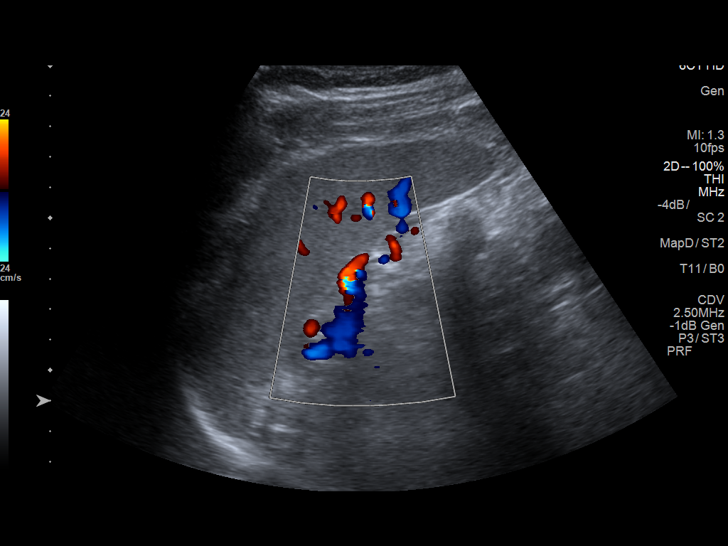
[im 9/19]
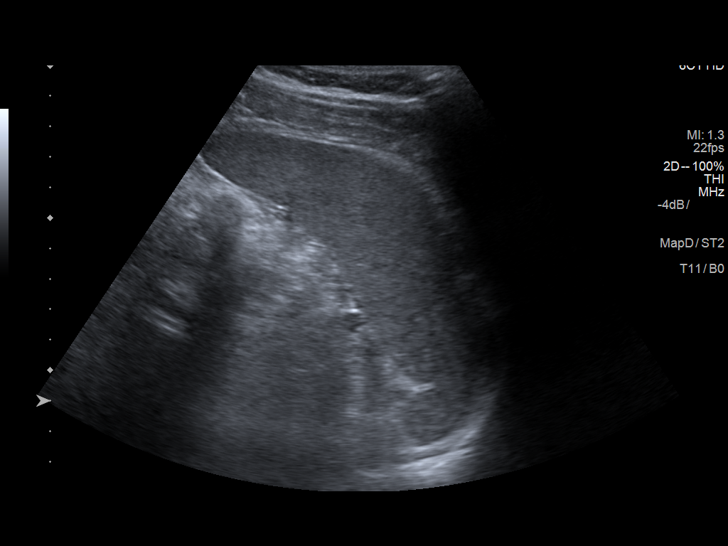
[im 11/19]
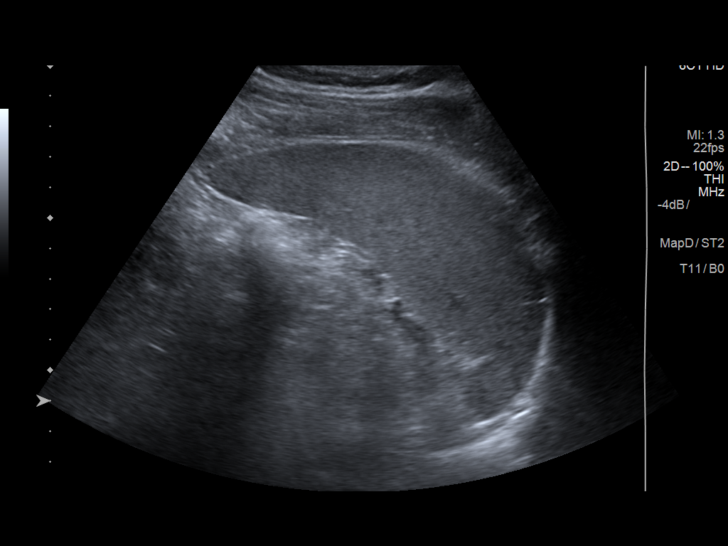
[im 12/19]
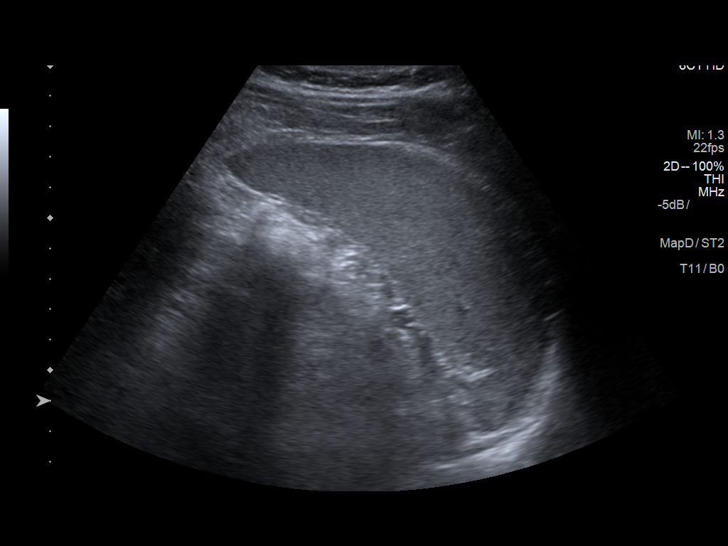
[im 13/19]
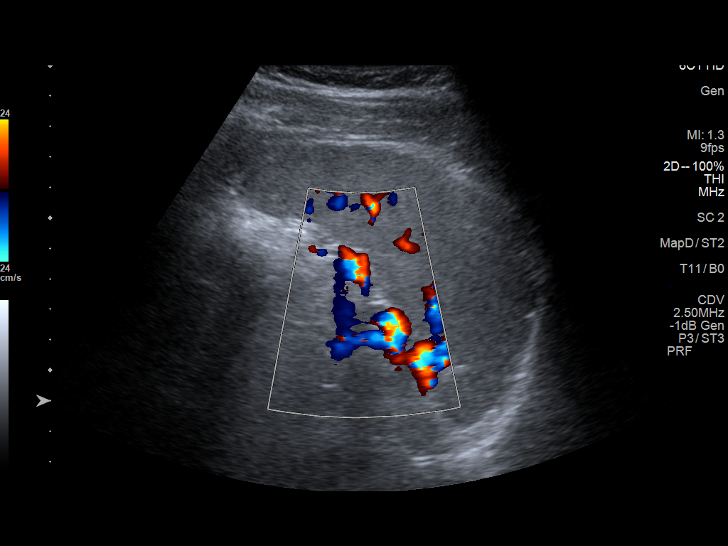
[im 15/19]
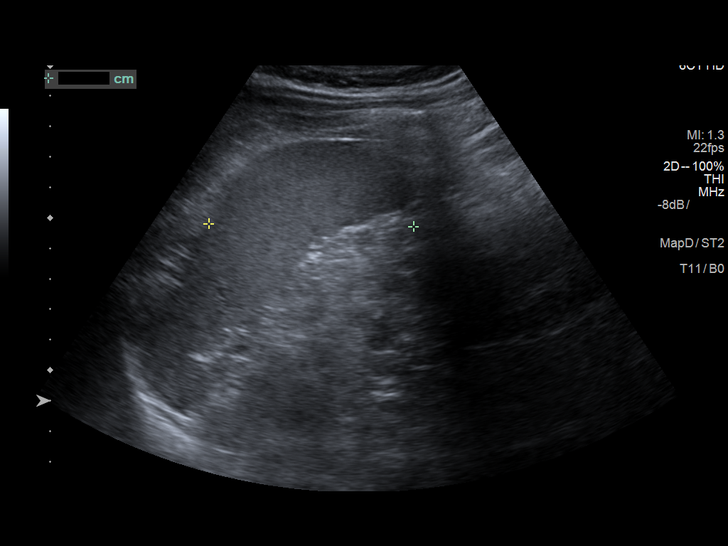
[im 16/19]
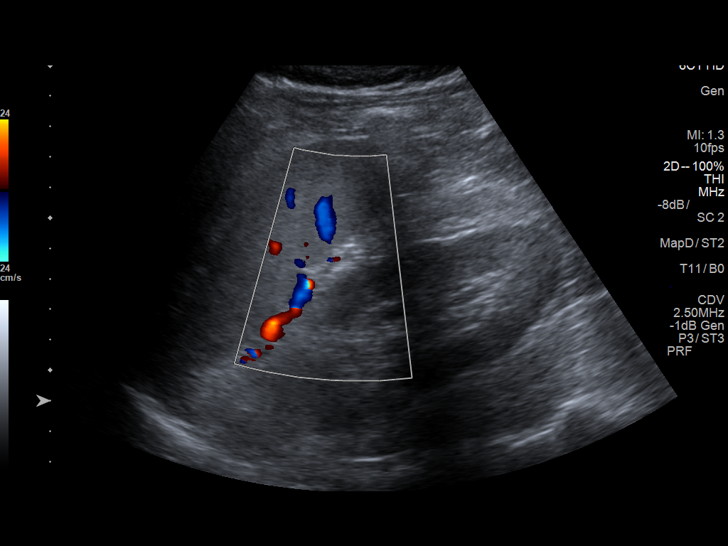
[im 17/19]
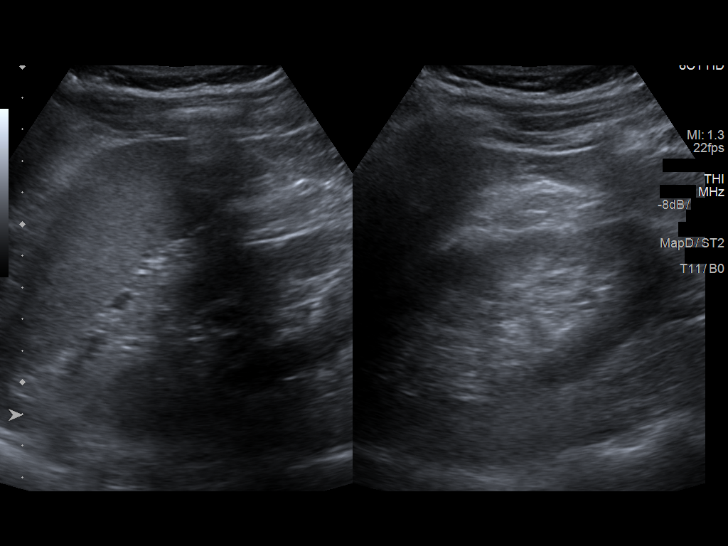
[im 19/19]
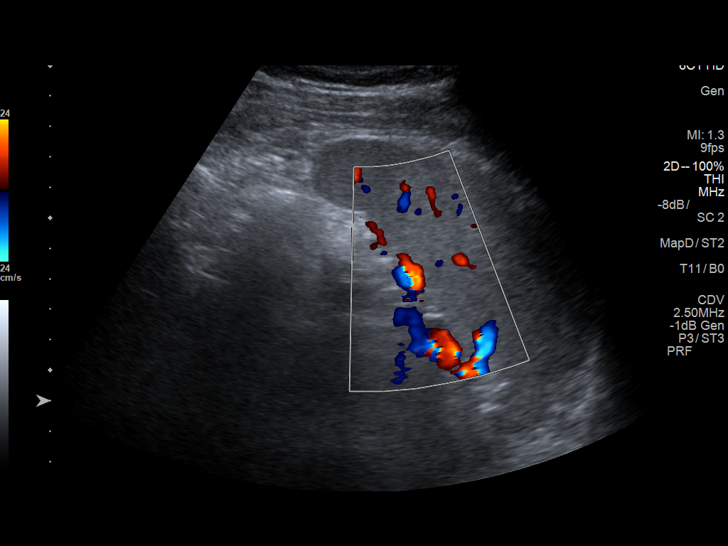

[14 of 19 positions shown; findings below may reference images not displayed]

FINDINGS: Spleen appears normal in size and contour. Splenic length is 9.0 cm.
No perisplenic fluid or adenopathy.
IMPRESSION: Normal appearing spleen by ultrasound.

## 2020-02-02 ENCOUNTER — Other Ambulatory Visit: Payer: Self-pay

## 2020-02-10 ENCOUNTER — Other Ambulatory Visit: Payer: Self-pay

## 2020-02-14 ENCOUNTER — Other Ambulatory Visit: Payer: Self-pay

## 2020-02-14 ENCOUNTER — Ambulatory Visit: Payer: Self-pay | Attending: Internal Medicine

## 2020-02-14 DIAGNOSIS — Z20822 Contact with and (suspected) exposure to covid-19: Secondary | ICD-10-CM | POA: Insufficient documentation

## 2020-02-15 LAB — NOVEL CORONAVIRUS, NAA: SARS-CoV-2, NAA: NOT DETECTED

## 2020-02-15 LAB — SARS-COV-2, NAA 2 DAY TAT

## 2020-06-15 ENCOUNTER — Other Ambulatory Visit: Payer: Self-pay

## 2020-06-15 ENCOUNTER — Encounter (HOSPITAL_COMMUNITY): Payer: Self-pay | Admitting: *Deleted

## 2020-06-15 ENCOUNTER — Emergency Department (HOSPITAL_COMMUNITY)
Admission: EM | Admit: 2020-06-15 | Discharge: 2020-06-15 | Disposition: A | Discharge status: Home / Self Care | Payer: Self-pay | Attending: Emergency Medicine | Admitting: Emergency Medicine

## 2020-06-15 DIAGNOSIS — E119 Type 2 diabetes mellitus without complications: Secondary | ICD-10-CM | POA: Insufficient documentation

## 2020-06-15 DIAGNOSIS — Z760 Encounter for issue of repeat prescription: Secondary | ICD-10-CM | POA: Insufficient documentation

## 2020-06-15 DIAGNOSIS — Z79899 Other long term (current) drug therapy: Secondary | ICD-10-CM | POA: Insufficient documentation

## 2020-06-15 DIAGNOSIS — Z7984 Long term (current) use of oral hypoglycemic drugs: Secondary | ICD-10-CM | POA: Insufficient documentation

## 2020-06-15 DIAGNOSIS — I10 Essential (primary) hypertension: Secondary | ICD-10-CM | POA: Insufficient documentation

## 2020-06-15 LAB — CBG MONITORING, ED: Glucose-Capillary: 139 mg/dL — ABNORMAL HIGH (ref 70–99)

## 2020-06-15 MED ORDER — AMLODIPINE BESYLATE 5 MG PO TABS: 5.0000 mg | ORAL_TABLET | Freq: Every day | ORAL | 0 refills | Status: DC

## 2020-06-15 MED ORDER — METFORMIN HCL 500 MG PO TABS: 500.0000 mg | ORAL_TABLET | Freq: Two times a day (BID) | ORAL | 0 refills | Status: DC

## 2020-06-15 NOTE — Discharge Instructions (Signed)
Contact one of the providers listed to establish primary care.

## 2020-06-15 NOTE — ED Triage Notes (Signed)
States he ran out of his medication for diabetes and hypertension. Wants a refill

## 2020-06-15 NOTE — ED Notes (Signed)
I seen patient was here for med refill, called patient while he was in waiting room to see exactly what he's taking and what meds he needs filled. Patient stated he takes amlodipine, metformin, a meter to test blood sugar, test strips and lancets. He stated last dose taken for both medications was 06/08/20.  I called patients pharmacy. Pharmacist stated patient has refills on amlodipine, never filled metformin, never filled test strips or lancets.   I called patient to let him know he has refills on amlodipine, but [facility] has never filled metformin. I asked patient where he rec'd this med from, he stated [facility] wrote him a prescription for it and he lost it somewhere. I informed patient that almost everything is sent in electronically now, his pharmacy was already listed in epic, and that it should've went there but [facility] states it is not there. Patient states he needs this medication regardless.

## 2020-06-15 NOTE — ED Provider Notes (Signed)
[facility] EMERGENCY DEPARTMENT Provider Note   CSN: [id number] Arrival date & time: 06/15/20  1526     History Chief Complaint  Patient presents with  . Medication Refill    Edward Roman is a 39 y.o. male.  HPI      Edward Roman is a 39 y.o. male with past medical history of hypertension and type II diabetes who presents to the Emergency Department requesting refills for his antihypertensive medication and his diabetes medication.  Denies symptoms at this time.  States he does not currently have a PCP and ran out of his medication 1 week ago.   Past Medical History:  Diagnosis Date  . Hypertension     There are no problems to display for this patient.   History reviewed. No pertinent surgical history.     Family History  Problem Relation Age of Onset  . Hypertension Mother   . Diabetes Mother   . Hypertension Father   . Diabetes Father   . Hypertension Other   . Diabetes Other   . Thyroid disease Other     Social History   Tobacco Use  . Smoking status: Never Smoker  . Smokeless tobacco: Never Used  Substance Use Topics  . Alcohol use: No  . Drug use: No    Home Medications Prior to Admission medications   Medication Sig Start Date End Date Taking? Authorizing Provider  amLODipine (NORVASC) 5 MG tablet Take 1 tablet (5 mg total) by mouth daily. 11/25/19 11/24/20 Yes Hinda Kehr, MD  metFORMIN (GLUCOPHAGE) 500 MG tablet Take 1 tablet (500 mg total) by mouth 2 (two) times daily with a meal. 11/24/19 11/23/20 Yes Nance Pear, MD  blood glucose meter kit and supplies KIT Dispense based on patient and insurance preference. Use up to four times daily as directed. (FOR ICD-9 250.00, 250.01). 08/15/19   Harvest Dark, MD  blood glucose meter kit and supplies KIT Dispense based on patient and insurance preference. Use up to four times daily as directed. (FOR ICD-9 250.00, 250.01). 11/24/19   Nance Pear, MD  cetirizine (ZYRTEC ALLERGY) 10  MG tablet Take 1 tablet (10 mg total) by mouth daily for 10 days. 09/01/19 09/11/19  Lannie Fields, PA-C  clindamycin (CLEOCIN) 300 MG capsule Take 1 capsule (300 mg total) by mouth 3 (three) times daily. Patient not taking: Reported on 11/24/2019 06/22/18   Recardo Evangelist, PA-C  cyclobenzaprine (FLEXERIL) 10 MG tablet Take 1 tablet (10 mg total) by mouth 3 (three) times daily as needed. Patient not taking: Reported on 11/24/2019 02/18/18   Sable Feil, PA-C  doxycycline (VIBRAMYCIN) 100 MG capsule Take 1 capsule (100 mg total) by mouth 2 (two) times daily. Patient not taking: Reported on 11/24/2019 05/20/18   Fransico Meadow, PA-C  hydrochlorothiazide (HYDRODIURIL) 25 MG tablet Take 1 tablet (25 mg total) by mouth daily. Patient not taking: Reported on 11/24/2019 10/08/18   Lily Kocher, PA-C  ibuprofen (ADVIL,MOTRIN) 800 MG tablet Take 1 tablet (800 mg total) by mouth 3 (three) times daily. Patient not taking: Reported on 11/24/2019 06/22/18   Recardo Evangelist, PA-C  pantoprazole (PROTONIX) 20 MG tablet Take 1 tablet (20 mg total) by mouth daily. 08/15/19 10/14/19  Harvest Dark, MD  traMADol Veatrice Bourbon) 50 MG tablet 1 or 2 po q6h prn pain Patient not taking: Reported on 11/24/2019 10/08/18   Lily Kocher, PA-C    Allergies    Penicillin g and Penicillins  Review of Systems  Review of Systems  Constitutional: Negative for chills, fatigue and fever.  Respiratory: Negative for cough and shortness of breath.   Cardiovascular: Negative for chest pain.  Gastrointestinal: Negative for abdominal pain, nausea and vomiting.  Genitourinary: Negative for dysuria, flank pain and hematuria.  Musculoskeletal: Negative for arthralgias, back pain, myalgias and neck stiffness.  Skin: Negative for rash.  Neurological: Negative for dizziness, weakness, numbness and headaches.  Hematological: Does not bruise/bleed easily.    Physical Exam Updated Vital Signs BP (!) 145/107 (BP Location: Right  Arm)   Pulse 84   Temp 98 F (36.7 C) (Oral)   Resp 18   Ht 5' 4"  (1.626 m)   Wt 87.1 kg   SpO2 99%   BMI 32.96 kg/m   Physical Exam Vitals and nursing note reviewed.  Constitutional:      Appearance: Normal appearance. He is not ill-appearing or toxic-appearing.  HENT:     Head: Normocephalic.  Neck:     Thyroid: No thyromegaly.     Meningeal: Kernig's sign absent.  Cardiovascular:     Rate and Rhythm: Normal rate and regular rhythm.     Pulses: Normal pulses.  Pulmonary:     Effort: Pulmonary effort is normal. No respiratory distress.     Breath sounds: Normal breath sounds.  Abdominal:     Palpations: Abdomen is soft.     Tenderness: There is no abdominal tenderness. There is no guarding or rebound.  Musculoskeletal:        General: Normal range of motion.     Cervical back: Normal range of motion. No tenderness.     Right lower leg: No edema.     Left lower leg: No edema.  Skin:    General: Skin is warm.     Capillary Refill: Capillary refill takes less than 2 seconds.     Findings: No rash.  Neurological:     General: No focal deficit present.     Mental Status: He is alert.     Sensory: No sensory deficit.     Motor: No weakness.     Comments: CN II through XII grossly intact.  Speech clear.     ED Results / Procedures / Treatments   Labs (all labs ordered are listed, but only abnormal results are displayed) Labs Reviewed  CBG MONITORING, ED - Abnormal; Notable for the following components:      Result Value   Glucose-Capillary 139 (*)    All other components within normal limits    EKG None  Radiology No results found.  Procedures Procedures (including critical care time)  Medications Ordered in ED Medications - No data to display  ED Course  I have reviewed the triage vital signs and the nursing notes.  Pertinent labs & imaging results that were available during my care of the patient were reviewed by me and considered in my medical  decision making (see chart for details).    MDM Rules/Calculators/A&P                          Patient well-appearing.  Mildly hypertensive, CBG shows blood sugar of 137.  Patient out of his amlodipine and Metformin for 1 week.  No symptoms currently.  Doubt emergent process.  Will refill patient's medications, given referral information for local providers to establish primary care.  Patient agrees to plan.   Final Clinical Impression(s) / ED Diagnoses Final diagnoses:  Encounter for medication refill  Rx / DC Orders ED Discharge Orders    None       Kem Parkinson, PA-C 06/15/20 1701    Wyvonnia Dusky, MD 06/16/20 [phone]

## 2020-10-05 ENCOUNTER — Encounter (HOSPITAL_COMMUNITY): Payer: Self-pay | Admitting: *Deleted

## 2020-10-05 ENCOUNTER — Emergency Department (HOSPITAL_COMMUNITY)
Admission: EM | Admit: 2020-10-05 | Discharge: 2020-10-05 | Disposition: A | Discharge status: Home / Self Care | Payer: Self-pay | Attending: Emergency Medicine | Admitting: Emergency Medicine

## 2020-10-05 ENCOUNTER — Other Ambulatory Visit: Payer: Self-pay

## 2020-10-05 DIAGNOSIS — Z79899 Other long term (current) drug therapy: Secondary | ICD-10-CM | POA: Insufficient documentation

## 2020-10-05 DIAGNOSIS — Z7984 Long term (current) use of oral hypoglycemic drugs: Secondary | ICD-10-CM | POA: Insufficient documentation

## 2020-10-05 DIAGNOSIS — E119 Type 2 diabetes mellitus without complications: Secondary | ICD-10-CM | POA: Insufficient documentation

## 2020-10-05 DIAGNOSIS — I10 Essential (primary) hypertension: Secondary | ICD-10-CM | POA: Insufficient documentation

## 2020-10-05 DIAGNOSIS — Z760 Encounter for issue of repeat prescription: Secondary | ICD-10-CM | POA: Insufficient documentation

## 2020-10-05 HISTORY — DX: Type 2 diabetes mellitus without complications: E11.9

## 2020-10-05 LAB — CBG MONITORING, ED: Glucose-Capillary: 239 mg/dL — ABNORMAL HIGH (ref 70–99)

## 2020-10-05 MED ORDER — BLOOD GLUCOSE MONITOR KIT: PACK | 0 refills | Status: DC

## 2020-10-05 MED ORDER — AMLODIPINE BESYLATE 5 MG PO TABS
5.0000 mg | ORAL_TABLET | Freq: Once | ORAL | Status: AC
  Administered 2020-10-05: 5 mg via ORAL
  Filled 2020-10-05: qty 1

## 2020-10-05 MED ORDER — METFORMIN HCL 500 MG PO TABS: 500.0000 mg | ORAL_TABLET | Freq: Two times a day (BID) | ORAL | 2 refills | Status: DC

## 2020-10-05 MED ORDER — METFORMIN HCL 500 MG PO TABS
500.0000 mg | ORAL_TABLET | Freq: Once | ORAL | Status: AC
  Administered 2020-10-05: 500 mg via ORAL
  Filled 2020-10-05: qty 1

## 2020-10-05 MED ORDER — AMLODIPINE BESYLATE 5 MG PO TABS: 5.0000 mg | ORAL_TABLET | Freq: Every day | ORAL | 3 refills | Status: DC

## 2020-10-05 NOTE — ED Triage Notes (Signed)
Pt wanted sugar checked, refill on medication or medication changed for DM.  Pt does not have a PCP.

## 2020-10-05 NOTE — Discharge Instructions (Signed)
Follow-up with the primary care doctor to further prescribe your medicines.

## 2020-10-05 NOTE — ED Notes (Signed)
Entered room and introduced self to patient. Pt appears to be resting in bed, respirations are even and unlabored with equal chest rise and fall. Bed is locked in the lowest position, side rails x2, call bell within reach. Pt educated on call light use and hourly rounding, verbalized understanding and in agreement at this time. All questions and concerns voiced addressed. Refreshments offered and provided per patient request.  Will continue to monitor.   

## 2020-10-05 NOTE — ED Provider Notes (Signed)
[facility] EMERGENCY DEPARTMENT Provider Note   CSN: [id number] Arrival date & time: 10/05/20  4097     History Chief Complaint  Patient presents with  . Medication Refill    Edward Roman is a 39 y.o. male.  HPI Patient presents for medication refill.  States he has hypertension diabetes and is out of his medicines.  States he has been out for around 3 days.  Does not have a primary care doctor.  States he can sometimes feel his sugar go high but does not feel as if it is high right now.  No chest pain.  No trouble breathing.  States he is on Metformin 500 mg and amlodipine 5 mg.    Past Medical History:  Diagnosis Date  . Diabetes mellitus without complication ([place])   . Hypertension     There are no problems to display for this patient.   History reviewed. No pertinent surgical history.     Family History  Problem Relation Age of Onset  . Hypertension Mother   . Diabetes Mother   . Hypertension Father   . Diabetes Father   . Hypertension Other   . Diabetes Other   . Thyroid disease Other     Social History   Tobacco Use  . Smoking status: Never Smoker  . Smokeless tobacco: Never Used  Substance Use Topics  . Alcohol use: No  . Drug use: No    Home Medications Prior to Admission medications   Medication Sig Start Date End Date Taking? Authorizing Provider  amLODipine (NORVASC) 5 MG tablet Take 1 tablet (5 mg total) by mouth daily. 10/05/20 02/02/21  Davonna Belling, MD  blood glucose meter kit and supplies KIT Dispense based on patient and insurance preference. Use up to four times daily as directed. (FOR ICD-9 250.00, 250.01). 10/05/20   Davonna Belling, MD  cetirizine (ZYRTEC ALLERGY) 10 MG tablet Take 1 tablet (10 mg total) by mouth daily for 10 days. 09/01/19 09/11/19  Lannie Fields, PA-C  clindamycin (CLEOCIN) 300 MG capsule Take 1 capsule (300 mg total) by mouth 3 (three) times daily. Patient not taking: Reported on 11/24/2019 06/22/18   Recardo Evangelist, PA-C  cyclobenzaprine (FLEXERIL) 10 MG tablet Take 1 tablet (10 mg total) by mouth 3 (three) times daily as needed. Patient not taking: Reported on 11/24/2019 02/18/18   Sable Feil, PA-C  ibuprofen (ADVIL,MOTRIN) 800 MG tablet Take 1 tablet (800 mg total) by mouth 3 (three) times daily. Patient not taking: Reported on 11/24/2019 06/22/18   Recardo Evangelist, PA-C  metFORMIN (GLUCOPHAGE) 500 MG tablet Take 1 tablet (500 mg total) by mouth 2 (two) times daily with a meal. 10/05/20 01/03/21  Davonna Belling, MD  pantoprazole (PROTONIX) 20 MG tablet Take 1 tablet (20 mg total) by mouth daily. 08/15/19 10/14/19  Harvest Dark, MD  traMADol Veatrice Bourbon) 50 MG tablet 1 or 2 po q6h prn pain Patient not taking: Reported on 11/24/2019 10/08/18   Lily Kocher, PA-C  hydrochlorothiazide (HYDRODIURIL) 25 MG tablet Take 1 tablet (25 mg total) by mouth daily. Patient not taking: Reported on 11/24/2019 10/08/18 10/05/20  Lily Kocher, PA-C    Allergies    Penicillin g and Penicillins  Review of Systems   Review of Systems  Constitutional: Negative for appetite change.  HENT: Negative for congestion.   Respiratory: Negative for shortness of breath.   Cardiovascular: Negative for chest pain.  Gastrointestinal: Negative for abdominal pain.  Endocrine: Negative for polyuria.  Genitourinary:  Negative for flank pain.  Musculoskeletal: Negative for back pain.  Skin: Negative for rash.  Neurological: Negative for weakness.  Psychiatric/Behavioral: Negative for confusion.    Physical Exam Updated Vital Signs BP (!) 158/102 (BP Location: Left Arm)   Pulse 87   Temp 98.2 F (36.8 C) (Oral)   Resp 19   Ht _0  (1.626 m)   Wt 83.4 kg   SpO2 98%   BMI 31.55 kg/m   Physical Exam Vitals and nursing note reviewed.  HENT:     Head: Normocephalic.  Eyes:     Extraocular Movements: Extraocular movements intact.     Pupils: Pupils are equal, round, and reactive to light.  Cardiovascular:      Rate and Rhythm: Regular rhythm.  Pulmonary:     Breath sounds: No wheezing or rhonchi.  Abdominal:     Tenderness: There is no abdominal tenderness.  Musculoskeletal:        General: No tenderness.     Right lower leg: No edema.     Left lower leg: No edema.  Skin:    General: Skin is warm.     Capillary Refill: Capillary refill takes less than 2 seconds.  Neurological:     Mental Status: He is alert and oriented to person, place, and time.     ED Results / Procedures / Treatments   Labs (all labs ordered are listed, but only abnormal results are displayed) Labs Reviewed  CBG MONITORING, ED - Abnormal; Notable for the following components:      Result Value   Glucose-Capillary 239 (*)    All other components within normal limits    EKG None  Radiology No results found.  Procedures Procedures (including critical care time)  Medications Ordered in ED Medications  metFORMIN (GLUCOPHAGE) tablet 500 mg (has no administration in time range)  amLODipine (NORVASC) tablet 5 mg (has no administration in time range)    ED Course  I have reviewed the triage vital signs and the nursing notes.  Pertinent labs & imaging results that were available during my care of the patient were reviewed by me and considered in my medical decision making (see chart for details).    MDM Rules/Calculators/A&P                          Patient with diabetes and hypertension.  Does not have a PCP.  Requesting refills.  Refills given.  Instructed on need for PCP follow-up.  Doubt acute endorgan damage.  CBG only mildly elevated.  Does not appear to need more extensive work-up at this time Final Clinical Impression(s) / ED Diagnoses Final diagnoses:  Medication refill    Rx / DC Orders ED Discharge Orders         Ordered    amLODipine (NORVASC) 5 MG tablet  Daily        10/05/20 1959    metFORMIN (GLUCOPHAGE) 500 MG tablet  2 times daily with meals        10/05/20 1959    blood  glucose meter kit and supplies KIT        10/05/20 1959           Davonna Belling, MD 10/05/20 2001

## 2021-01-18 ENCOUNTER — Encounter (HOSPITAL_COMMUNITY): Payer: Self-pay | Admitting: *Deleted

## 2021-01-18 ENCOUNTER — Emergency Department (HOSPITAL_COMMUNITY)
Admission: EM | Admit: 2021-01-18 | Discharge: 2021-01-18 | Disposition: A | Discharge status: Home / Self Care | Payer: Self-pay | Attending: Emergency Medicine | Admitting: Emergency Medicine

## 2021-01-18 ENCOUNTER — Other Ambulatory Visit: Payer: Self-pay

## 2021-01-18 DIAGNOSIS — Z7984 Long term (current) use of oral hypoglycemic drugs: Secondary | ICD-10-CM | POA: Insufficient documentation

## 2021-01-18 DIAGNOSIS — I10 Essential (primary) hypertension: Secondary | ICD-10-CM | POA: Insufficient documentation

## 2021-01-18 DIAGNOSIS — Z79899 Other long term (current) drug therapy: Secondary | ICD-10-CM | POA: Insufficient documentation

## 2021-01-18 DIAGNOSIS — R739 Hyperglycemia, unspecified: Secondary | ICD-10-CM

## 2021-01-18 DIAGNOSIS — E1165 Type 2 diabetes mellitus with hyperglycemia: Secondary | ICD-10-CM | POA: Insufficient documentation

## 2021-01-18 LAB — CBC WITH DIFFERENTIAL/PLATELET
Abs Immature Granulocytes: 0.02 10*3/uL (ref 0.00–0.07)
Basophils Absolute: 0 10*3/uL (ref 0.0–0.1)
Basophils Relative: 1 %
Eosinophils Absolute: 0.1 10*3/uL (ref 0.0–0.5)
Eosinophils Relative: 1 %
HCT: 42.8 % (ref 39.0–52.0)
Hemoglobin: 14.9 g/dL (ref 13.0–17.0)
Immature Granulocytes: 0 %
Lymphocytes Relative: 27 %
Lymphs Abs: 1.9 10*3/uL (ref 0.7–4.0)
MCH: 30.3 pg (ref 26.0–34.0)
MCHC: 34.8 g/dL (ref 30.0–36.0)
MCV: 87.2 fL (ref 80.0–100.0)
Monocytes Absolute: 0.5 10*3/uL (ref 0.1–1.0)
Monocytes Relative: 7 %
Neutro Abs: 4.5 10*3/uL (ref 1.7–7.7)
Neutrophils Relative %: 64 %
Platelets: 308 10*3/uL (ref 150–400)
RBC: 4.91 MIL/uL (ref 4.22–5.81)
RDW: 11.9 % (ref 11.5–15.5)
WBC: 7 10*3/uL (ref 4.0–10.5)
nRBC: 0 % (ref 0.0–0.2)

## 2021-01-18 LAB — COMPREHENSIVE METABOLIC PANEL
ALT: 19 U/L (ref 0–44)
AST: 18 U/L (ref 15–41)
Albumin: 4.7 g/dL (ref 3.5–5.0)
Alkaline Phosphatase: 66 U/L (ref 38–126)
Anion gap: 12 (ref 5–15)
BUN: 12 mg/dL (ref 6–20)
CO2: 24 mmol/L (ref 22–32)
Calcium: 9.9 mg/dL (ref 8.9–10.3)
Chloride: 97 mmol/L — ABNORMAL LOW (ref 98–111)
Creatinine, Ser: 0.99 mg/dL (ref 0.61–1.24)
GFR, Estimated: >60 mL/min (ref >60)
Glucose, Bld: 442 mg/dL — ABNORMAL HIGH (ref 70–99)
Potassium: 4.1 mmol/L (ref 3.5–5.1)
Sodium: 133 mmol/L — ABNORMAL LOW (ref 135–145)
Total Bilirubin: 0.6 mg/dL (ref 0.3–1.2)
Total Protein: 8.4 g/dL — ABNORMAL HIGH (ref 6.5–8.1)

## 2021-01-18 LAB — BLOOD GAS, VENOUS
Acid-Base Excess: 0.9 mmol/L (ref 0.0–2.0)
Bicarbonate: 22.8 mmol/L (ref 20.0–28.0)
FIO2: 21
O2 Saturation: 27.7 %
Patient temperature: 37
pCO2, Ven: 52.9 mmHg (ref 44.0–60.0)
pH, Ven: 7.318 (ref 7.250–7.430)
pO2, Ven: <31 mmHg — CL (ref 32.0–45.0)

## 2021-01-18 LAB — URINALYSIS, ROUTINE W REFLEX MICROSCOPIC
Bacteria, UA: NONE SEEN
Bilirubin Urine: NEGATIVE
Glucose, UA: >=500 mg/dL — AB
Hgb urine dipstick: NEGATIVE
Ketones, ur: NEGATIVE mg/dL
Leukocytes,Ua: NEGATIVE
Nitrite: NEGATIVE
Protein, ur: NEGATIVE mg/dL
Specific Gravity, Urine: 1.024 (ref 1.005–1.030)
pH: 6 (ref 5.0–8.0)

## 2021-01-18 LAB — LIPASE, BLOOD: Lipase: 26 U/L (ref 11–51)

## 2021-01-18 LAB — CBG MONITORING, ED: Glucose-Capillary: 443 mg/dL — ABNORMAL HIGH (ref 70–99)

## 2021-01-18 LAB — BETA-HYDROXYBUTYRIC ACID: Beta-Hydroxybutyric Acid: 0.16 mmol/L (ref 0.05–0.27)

## 2021-01-18 MED ORDER — SODIUM CHLORIDE 0.9 % IV BOLUS
1000.0000 mL | Freq: Once | INTRAVENOUS | Status: AC
  Administered 2021-01-18: 1000 mL via INTRAVENOUS

## 2021-01-18 MED ORDER — GLIPIZIDE 5 MG PO TABS: 5.0000 mg | ORAL_TABLET | Freq: Two times a day (BID) | ORAL | 0 refills | Status: DC

## 2021-01-18 MED ORDER — METFORMIN HCL 500 MG PO TABS: 500.0000 mg | ORAL_TABLET | Freq: Two times a day (BID) | ORAL | 0 refills | Status: DC

## 2021-01-18 MED ORDER — AMLODIPINE BESYLATE 5 MG PO TABS: 5.0000 mg | ORAL_TABLET | Freq: Every day | ORAL | 1 refills | Status: DC

## 2021-01-18 NOTE — Discharge Instructions (Signed)
Please take the following medications every day  Amlodipine, 5 mg a day to help with blood pressure Metformin, 500 mg twice a day to help with blood sugar Glipizide 5 mg twice a day to help with blood sugar  You must start to follow a more strict diabetic diet which means less carbohydrates like sugars, rices, breads, sweets and stick to healthier options including lean meats vegetables and fruits.  You will also need to follow-up with a family doctor, please see the list below however if you should develop severe or worsening symptoms return to the emergency department immediately  [facility] Primary Care Doctor List    Kari Baars MD. Specialty: Pulmonary Disease Contact information: [address]  [address]  [place] [address]  [phone]   Syliva Overman, MD. Specialty: [facility] Contact information: [address]  [place] [address]  [phone]   Lilyan Punt, MD. Specialty: Family Medicine Contact information: [address]  [place] [address]  [phone]   Avon Gully, MD Specialty: Internal Medicine Contact information: [address] [place] [address]  [phone]   Catalina Pizza, MD. Specialty: Internal Medicine Contact information: [address]  [place] [address]  [phone]    [facility] (Dr. Selena Batten) Specialty: Family Medicine Contact information: [address]  [place] [address]  [phone]   John Giovanni, MD. Specialty: [facility] Contact information: [address]  [address]  [place] [address]  [phone]   Carylon Perches, MD. Specialty: Internal Medicine Contact information: [address]  [address]  [place] [address]  [phone]    [facility]  [address] [place], [place] [address] [phone]  Services [facility] offers a variety of basic health  services.  Services include but are not limited to: Blood pressure checks  Heart rate checks  Blood sugar checks  Urine analysis  Rapid strep tests  Pregnancy tests.  Health education and referrals

## 2021-01-18 NOTE — ED Provider Notes (Signed)
[facility] EMERGENCY DEPARTMENT Provider Note   CSN: [id number] Arrival date & time: 01/18/21  1504     History Chief Complaint  Patient presents with  . Hyperglycemia    Edward Roman is a 40 y.o. male history of hypertension and type 2 diabetes.  Patient reports he currently takes amlodipine as well as Metformin no other daily medication use.  Patient reports that over the past 2-3 days he has been eating a lot of sugary foods such as moon pies.  He reports that his blood sugar has been running high at home despite use of his Metformin.  He reports some increased thirst as well as polyuria that he would like evaluated today.  He also reports that he ran out of his amlodipine 4 days ago and would like a refill today.  Denies fever/chills, fall/injury, headache, confusion, chest pain/shortness of breath, abdominal pain, nausea/vomiting, diarrhea, dysuria/hematuria, numbness/tingling, weakness or any additional concerns.  HPI     Past Medical History:  Diagnosis Date  . Diabetes mellitus without complication ([place])   . Hypertension     There are no problems to display for this patient.   History reviewed. No pertinent surgical history.     Family History  Problem Relation Age of Onset  . Hypertension Mother   . Diabetes Mother   . Hypertension Father   . Diabetes Father   . Hypertension Other   . Diabetes Other   . Thyroid disease Other     Social History   Tobacco Use  . Smoking status: Never Smoker  . Smokeless tobacco: Never Used  Vaping Use  . Vaping Use: Never used  Substance Use Topics  . Alcohol use: No  . Drug use: No    Home Medications Prior to Admission medications   Medication Sig Start Date End Date Taking? Authorizing Provider  glipiZIDE (GLUCOTROL) 5 MG tablet Take 1 tablet (5 mg total) by mouth 2 (two) times daily before a meal. 01/18/21 04/18/21 Yes Noemi Chapel, MD  amLODipine (NORVASC) 5 MG tablet Take 1 tablet (5 mg total) by mouth  daily. 01/18/21 07/17/21  Noemi Chapel, MD  blood glucose meter kit and supplies KIT Dispense based on patient and insurance preference. Use up to four times daily as directed. (FOR ICD-9 250.00, 250.01). 10/05/20   Davonna Belling, MD  cetirizine (ZYRTEC ALLERGY) 10 MG tablet Take 1 tablet (10 mg total) by mouth daily for 10 days. 09/01/19 09/11/19  Lannie Fields, PA-C  metFORMIN (GLUCOPHAGE) 500 MG tablet Take 1 tablet (500 mg total) by mouth 2 (two) times daily with a meal. 01/18/21 04/18/21  Noemi Chapel, MD  pantoprazole (PROTONIX) 20 MG tablet Take 1 tablet (20 mg total) by mouth daily. 08/15/19 10/14/19  Harvest Dark, MD  hydrochlorothiazide (HYDRODIURIL) 25 MG tablet Take 1 tablet (25 mg total) by mouth daily. Patient not taking: Reported on 11/24/2019 10/08/18 10/05/20  Lily Kocher, PA-C    Allergies    Penicillin g and Penicillins  Review of Systems   Review of Systems Ten systems are reviewed and are negative for acute change except as noted in the HPI  Physical Exam Updated Vital Signs BP (!) 125/96   Pulse 98   Temp 98.4 F (36.9 C) (Oral)   Resp 18   Ht 5' 6"  (1.676 m)   Wt 82.1 kg   SpO2 98%   BMI 29.21 kg/m   Physical Exam Constitutional:      General: He is not in acute distress.  Appearance: Normal appearance. He is well-developed. He is not ill-appearing or diaphoretic.  HENT:     Head: Normocephalic and atraumatic.  Eyes:     General: Vision grossly intact. Gaze aligned appropriately.     Pupils: Pupils are equal, round, and reactive to light.  Neck:     Trachea: Trachea and phonation normal.  Pulmonary:     Effort: Pulmonary effort is normal. No respiratory distress.  Abdominal:     General: There is no distension.     Palpations: Abdomen is soft.     Tenderness: There is no abdominal tenderness. There is no guarding or rebound.  Musculoskeletal:        General: Normal range of motion.     Cervical back: Normal range of motion.  Skin:     General: Skin is warm and dry.  Neurological:     Mental Status: He is alert.     GCS: GCS eye subscore is 4. GCS verbal subscore is 5. GCS motor subscore is 6.     Comments: Speech is clear and goal oriented, follows commands Major Cranial nerves without deficit, no facial droop Moves extremities without ataxia, coordination intact  Psychiatric:        Behavior: Behavior normal.     ED Results / Procedures / Treatments   Labs (all labs ordered are listed, but only abnormal results are displayed) Labs Reviewed  COMPREHENSIVE METABOLIC PANEL - Abnormal; Notable for the following components:      Result Value   Sodium 133 (*)    Chloride 97 (*)    Glucose, Bld 442 (*)    Total Protein 8.4 (*)    All other components within normal limits  URINALYSIS, ROUTINE W REFLEX MICROSCOPIC - Abnormal; Notable for the following components:   Color, Urine STRAW (*)    Glucose, UA >=500 (*)    All other components within normal limits  BLOOD GAS, VENOUS - Abnormal; Notable for the following components:   pO2, Ven <31.0 (*)    All other components within normal limits  CBG MONITORING, ED - Abnormal; Notable for the following components:   Glucose-Capillary 443 (*)    All other components within normal limits  CBC WITH DIFFERENTIAL/PLATELET  LIPASE, BLOOD  BETA-HYDROXYBUTYRIC ACID    EKG None  Radiology No results found.  Procedures Procedures   Medications Ordered in ED Medications  sodium chloride 0.9 % bolus 1,000 mL (0 mLs Intravenous Stopped 01/18/21 1717)    ED Course  I have reviewed the triage vital signs and the nursing notes.  Pertinent labs & imaging results that were available during my care of the patient were reviewed by me and considered in my medical decision making (see chart for details).    MDM Rules/Calculators/A&P                         Additional history obtained from: 1. Nursing notes from this visit. 2. Review of electronic medical  records. ----------------- I ordered, reviewed and interpreted labs which include: Urinalysis shows glucose otherwise within normal limits.  No evidence for urinary tract infection and no ketones to suggest DKA Beta hydroxybutyric acid negative. VBG shows no acidosis. Lipase within normal limits. CBC within normal limits. CMP shows mild hyponatremia of 133 and hypokalemia 97.  Hyperglycemia 442.  No emergent electrolyte derangement, AKI, LFT elevations or gap.  Sodium chloride bolus given for repletion. CBG 443. - Patient appears to be hyperglycemic but there is  no evidence of diabetic emergency today specifically no evidence for DKA.  Additionally no evidence for infectious process or acute abdominal process.  No indication for further ER work-up at this time.  Patient was counseled on diet choices.  He will be started on glipizide for blood sugar control and follow-up closely with PCP for recheck.  Metformin and amlodipine were refilled today.  Patient asymptomatic regarding mild elevated blood pressure in the ER today.  At this time there does not appear to be any evidence of an acute emergency medical condition and the patient appears stable for discharge with appropriate outpatient follow up. Diagnosis was discussed with patient who verbalizes understanding of care plan and is agreeable to discharge. I have discussed return precautions with patient who verbalizes understanding. Patient encouraged to follow-up with their PCP. All questions answered.  Patient seen and evaluated by Dr. Sabra Heck during this visit who agrees with plan of care and completed AVS/Rx.   Note: Portions of this report may have been transcribed using voice recognition software. Every effort was made to ensure accuracy; however, inadvertent computerized transcription errors may still be present. Final Clinical Impression(s) / ED Diagnoses Final diagnoses:  Hyperglycemia    Rx / DC Orders ED Discharge Orders          Ordered    amLODipine (NORVASC) 5 MG tablet  Daily        01/18/21 1724    metFORMIN (GLUCOPHAGE) 500 MG tablet  2 times daily with meals        01/18/21 1724    glipiZIDE (GLUCOTROL) 5 MG tablet  2 times daily before meals        01/18/21 1724           Gari Crown 01/18/21 1741    Noemi Chapel, MD 01/19/21 [phone]

## 2021-01-18 NOTE — ED Provider Notes (Signed)
This patient is a very pleasant 40 year old male, he has been diagnosed with diabetes and hypertension and though he states that he is compliant with his Metformin he has been eating without any regard to his diabetes including lots of sweets lots of breads and has had increasing polyuria polydipsia and blood sugars.  No vomiting weakness nausea shortness of breath or fatigue.  On my exam he has unremarkable vital signs except for mild hypertension, his lungs are clear abdomen is soft mucous membranes are moist and he is in no distress.  Neurologically he has no complaints and no findings and metabolically other than hyperglycemia he has no signs of ketonuria or ketonemia, he is not in DKA, stable for discharge, will add glipizide, counseled the patient about his medications, refilled amlodipine, the patient is agreeable to the plan.  F/u list given for local PCP.  Medical screening examination/treatment/procedure(s) were conducted as a shared visit with non-physician practitioner(s) and myself.  I personally evaluated the patient during the encounter.  Clinical Impression:   Final diagnoses:  Hyperglycemia         Eber Hong, MD 01/19/21 1644

## 2021-01-18 NOTE — ED Triage Notes (Signed)
Pt c/o hyperglycemia, polyuria, polydipsia x 2 days. Pt reports he takes Metformin at home and has been taking it.   Pt also reports he is out of his Amlodipine x 4 days.

## 2021-05-18 ENCOUNTER — Encounter (HOSPITAL_COMMUNITY): Payer: Self-pay | Admitting: Emergency Medicine

## 2021-05-18 ENCOUNTER — Other Ambulatory Visit: Payer: Self-pay

## 2021-05-18 ENCOUNTER — Emergency Department (HOSPITAL_COMMUNITY)
Admission: EM | Admit: 2021-05-18 | Discharge: 2021-05-18 | Disposition: A | Discharge status: Home / Self Care | Payer: Self-pay | Attending: Emergency Medicine | Admitting: Emergency Medicine

## 2021-05-18 DIAGNOSIS — Z79899 Other long term (current) drug therapy: Secondary | ICD-10-CM | POA: Insufficient documentation

## 2021-05-18 DIAGNOSIS — I10 Essential (primary) hypertension: Secondary | ICD-10-CM | POA: Insufficient documentation

## 2021-05-18 DIAGNOSIS — R739 Hyperglycemia, unspecified: Secondary | ICD-10-CM

## 2021-05-18 DIAGNOSIS — Z7984 Long term (current) use of oral hypoglycemic drugs: Secondary | ICD-10-CM | POA: Insufficient documentation

## 2021-05-18 DIAGNOSIS — E1165 Type 2 diabetes mellitus with hyperglycemia: Secondary | ICD-10-CM | POA: Insufficient documentation

## 2021-05-18 LAB — I-STAT CHEM 8, ED
BUN: 17 mg/dL (ref 6–20)
Calcium, Ion: 1.23 mmol/L (ref 1.15–1.40)
Chloride: 98 mmol/L (ref 98–111)
Creatinine, Ser: 1 mg/dL (ref 0.61–1.24)
Glucose, Bld: 444 mg/dL — ABNORMAL HIGH (ref 70–99)
HCT: 46 % (ref 39.0–52.0)
Hemoglobin: 15.6 g/dL (ref 13.0–17.0)
Potassium: 4 mmol/L (ref 3.5–5.1)
Sodium: 134 mmol/L — ABNORMAL LOW (ref 135–145)
TCO2: 23 mmol/L (ref 22–32)

## 2021-05-18 LAB — CBC WITH DIFFERENTIAL/PLATELET
Abs Immature Granulocytes: 0.03 10*3/uL (ref 0.00–0.07)
Basophils Absolute: 0.1 10*3/uL (ref 0.0–0.1)
Basophils Relative: 1 %
Eosinophils Absolute: 0.1 10*3/uL (ref 0.0–0.5)
Eosinophils Relative: 1 %
HCT: 44.1 % (ref 39.0–52.0)
Hemoglobin: 15.7 g/dL (ref 13.0–17.0)
Immature Granulocytes: 0 %
Lymphocytes Relative: 31 %
Lymphs Abs: 3.1 10*3/uL (ref 0.7–4.0)
MCH: 30.5 pg (ref 26.0–34.0)
MCHC: 35.6 g/dL (ref 30.0–36.0)
MCV: 85.6 fL (ref 80.0–100.0)
Monocytes Absolute: 0.7 10*3/uL (ref 0.1–1.0)
Monocytes Relative: 7 %
Neutro Abs: 6.2 10*3/uL (ref 1.7–7.7)
Neutrophils Relative %: 60 %
Platelets: 374 10*3/uL (ref 150–400)
RBC: 5.15 MIL/uL (ref 4.22–5.81)
RDW: 11.9 % (ref 11.5–15.5)
WBC: 10.2 10*3/uL (ref 4.0–10.5)
nRBC: 0 % (ref 0.0–0.2)

## 2021-05-18 LAB — COMPREHENSIVE METABOLIC PANEL
ALT: 17 U/L (ref 0–44)
AST: 13 U/L — ABNORMAL LOW (ref 15–41)
Albumin: 5.1 g/dL — ABNORMAL HIGH (ref 3.5–5.0)
Alkaline Phosphatase: 89 U/L (ref 38–126)
Anion gap: 12 (ref 5–15)
BUN: 18 mg/dL (ref 6–20)
CO2: 25 mmol/L (ref 22–32)
Calcium: 10.3 mg/dL (ref 8.9–10.3)
Chloride: 94 mmol/L — ABNORMAL LOW (ref 98–111)
Creatinine, Ser: 1.21 mg/dL (ref 0.61–1.24)
GFR, Estimated: >60 mL/min (ref >60)
Glucose, Bld: 432 mg/dL — ABNORMAL HIGH (ref 70–99)
Potassium: 4 mmol/L (ref 3.5–5.1)
Sodium: 131 mmol/L — ABNORMAL LOW (ref 135–145)
Total Bilirubin: 1.7 mg/dL — ABNORMAL HIGH (ref 0.3–1.2)
Total Protein: 9.1 g/dL — ABNORMAL HIGH (ref 6.5–8.1)

## 2021-05-18 LAB — CBG MONITORING, ED
Glucose-Capillary: 397 mg/dL — ABNORMAL HIGH (ref 70–99)
Glucose-Capillary: 270 mg/dL — ABNORMAL HIGH (ref 70–99)

## 2021-05-18 MED ORDER — SODIUM CHLORIDE 0.9 % IV BOLUS
500.0000 mL | Freq: Once | INTRAVENOUS | Status: AC
  Administered 2021-05-18: 500 mL via INTRAVENOUS

## 2021-05-18 MED ORDER — SODIUM CHLORIDE 0.9 % IV BOLUS
2000.0000 mL | Freq: Once | INTRAVENOUS | Status: AC
  Administered 2021-05-18: 2000 mL via INTRAVENOUS

## 2021-05-18 MED ORDER — INSULIN ASPART 100 UNIT/ML IJ SOLN
10.0000 [IU] | Freq: Once | INTRAMUSCULAR | Status: AC
  Administered 2021-05-18: 10 [IU] via SUBCUTANEOUS
  Filled 2021-05-18: qty 1

## 2021-05-18 NOTE — ED Provider Notes (Signed)
[facility] EMERGENCY DEPARTMENT Provider Note   CSN: [id number] Arrival date & time: 05/18/21  0827     History Chief Complaint  Patient presents with   Other    Pt asking for glucose check.    Edward Roman is a 40 y.o. male.  Pt reports glucose is elevated after eating at [facility].  Pt is on glucotrol and Glucophage.  Pt reports he is taking as scheduled.  Pt reports glucose is high today.    The history is provided by the patient. No language interpreter was used.  Hyperglycemia Blood sugar level PTA:  400 Severity:  Moderate Onset quality:  Gradual Timing:  Constant Progression:  Worsening Chronicity:  New Context: new diabetes diagnosis   Relieved by:  Nothing Ineffective treatments:  None tried Associated symptoms: no dehydration   Risk factors: no family hx of diabetes       Past Medical History:  Diagnosis Date   Diabetes mellitus without complication ([place])    Hypertension     There are no problems to display for this patient.   History reviewed. No pertinent surgical history.     Family History  Problem Relation Age of Onset   Hypertension Mother    Diabetes Mother    Hypertension Father    Diabetes Father    Hypertension Other    Diabetes Other    Thyroid disease Other     Social History   Tobacco Use   Smoking status: Never   Smokeless tobacco: Never  Vaping Use   Vaping Use: Never used  Substance Use Topics   Alcohol use: No   Drug use: No    Home Medications Prior to Admission medications   Medication Sig Start Date End Date Taking? Authorizing Provider  amLODipine (NORVASC) 5 MG tablet Take 1 tablet (5 mg total) by mouth daily. 01/18/21 07/17/21  Noemi Chapel, MD  blood glucose meter kit and supplies KIT Dispense based on patient and insurance preference. Use up to four times daily as directed. (FOR ICD-9 250.00, 250.01). 10/05/20   Davonna Belling, MD  cetirizine (ZYRTEC ALLERGY) 10 MG tablet Take 1 tablet (10 mg total)  by mouth daily for 10 days. 09/01/19 09/11/19  Lannie Fields, PA-C  glipiZIDE (GLUCOTROL) 5 MG tablet Take 1 tablet (5 mg total) by mouth 2 (two) times daily before a meal. 01/18/21 04/18/21  Noemi Chapel, MD  metFORMIN (GLUCOPHAGE) 500 MG tablet Take 1 tablet (500 mg total) by mouth 2 (two) times daily with a meal. 01/18/21 04/18/21  Noemi Chapel, MD  pantoprazole (PROTONIX) 20 MG tablet Take 1 tablet (20 mg total) by mouth daily. 08/15/19 10/14/19  Harvest Dark, MD  hydrochlorothiazide (HYDRODIURIL) 25 MG tablet Take 1 tablet (25 mg total) by mouth daily. Patient not taking: Reported on 11/24/2019 10/08/18 10/05/20  Lily Kocher, PA-C    Allergies    Penicillin g and Penicillins  Review of Systems   Review of Systems  All other systems reviewed and are negative.  Physical Exam Updated Vital Signs BP 126/73   Pulse 97   Temp 97.9 F (36.6 C) (Oral)   Resp 19   Ht _0  (1.702 m)   Wt 82 kg   SpO2 97%   BMI 28.31 kg/m   Physical Exam Vitals and nursing note reviewed.  Constitutional:      Appearance: He is well-developed.  HENT:     Head: Normocephalic.  Cardiovascular:     Rate and Rhythm: Normal rate.  Pulmonary:  Effort: Pulmonary effort is normal.  Abdominal:     General: There is no distension.  Musculoskeletal:        General: Normal range of motion.     Cervical back: Normal range of motion.  Skin:    General: Skin is warm.  Neurological:     General: No focal deficit present.     Mental Status: He is alert and oriented to person, place, and time.  Psychiatric:        Mood and Affect: Mood normal.    ED Results / Procedures / Treatments   Labs (all labs ordered are listed, but only abnormal results are displayed) Labs Reviewed  COMPREHENSIVE METABOLIC PANEL - Abnormal; Notable for the following components:      Result Value   Sodium 131 (*)    Chloride 94 (*)    Glucose, Bld 432 (*)    Total Protein 9.1 (*)    Albumin 5.1 (*)    AST 13 (*)     Total Bilirubin 1.7 (*)    All other components within normal limits  I-STAT CHEM 8, ED - Abnormal; Notable for the following components:   Sodium 134 (*)    Glucose, Bld 444 (*)    All other components within normal limits  CBG MONITORING, ED - Abnormal; Notable for the following components:   Glucose-Capillary 397 (*)    All other components within normal limits  CBG MONITORING, ED - Abnormal; Notable for the following components:   Glucose-Capillary 270 (*)    All other components within normal limits  CBC WITH DIFFERENTIAL/PLATELET    EKG None  Radiology No results found.  Procedures Procedures   Medications Ordered in ED Medications  sodium chloride 0.9 % bolus 500 mL (0 mLs Intravenous Stopped 05/18/21 1109)  insulin aspart (novoLOG) injection 10 Units (10 Units Subcutaneous Given 05/18/21 1114)  sodium chloride 0.9 % bolus 2,000 mL (0 mLs Intravenous Stopped 05/18/21 1327)    ED Course  I have reviewed the triage vital signs and the nursing notes.  Pertinent labs & imaging results that were available during my care of the patient were reviewed by me and considered in my medical decision making (see chart for details).    MDM Rules/Calculators/A&P                          MDM:  Pt has elevated glucose.  CO2 and Anion gap normal.  Pt given IV fluids x 2 liters.  Insulin 10 units subq.   Pt referred to diabetic nutrition center.  Pt advised to avoid high carb meals  Final Clinical Impression(s) / ED Diagnoses Final diagnoses:  Hyperglycemia    Rx / DC Orders ED Discharge Orders     None     An After Visit Summary was printed and given to the patient.    Fransico Meadow, [place] 05/18/21 1845    Milton Ferguson, MD 05/20/21 [phone]

## 2021-05-18 NOTE — ED Triage Notes (Signed)
Pt is asking for glucose check.  Pt ate food from [employer] and thinks CBG is high.  Pt does not have glucose monitor at home.  CBG is 397 in triage.

## 2021-05-18 NOTE — Discharge Instructions (Addendum)
Schedule to see your Physician for recheck.  Return if any problems,.

## 2021-05-18 NOTE — ED Notes (Signed)
Pt states he has has been feeling tired lately because he had been out of his metformin for about 3 weeks up until a few days ago. He states he has not been watching his diet and began to feel tired and "not right". Friend talked him into coming up to get CBG check as he does not have a glucometer at home. BP noted to be elevated on arrival but pt states he had taken his amlodipine just before arrival today. Will continue to monitor. Pt is a/o x4 and in no distress at this times. Continues to complain of mild lethargy and feeling "hot". No diaphoresis noted. Temp within normal range. No n/v/d

## 2021-07-10 ENCOUNTER — Inpatient Hospital Stay (HOSPITAL_COMMUNITY)
Admission: EM | Admit: 2021-07-10 | Discharge: 2021-07-11 | DRG: 639 | Disposition: A | Discharge status: Home / Self Care | Payer: Self-pay | Attending: Internal Medicine | Admitting: Internal Medicine

## 2021-07-10 ENCOUNTER — Other Ambulatory Visit: Payer: Self-pay

## 2021-07-10 ENCOUNTER — Encounter (HOSPITAL_COMMUNITY): Payer: Self-pay

## 2021-07-10 DIAGNOSIS — E139 Other specified diabetes mellitus without complications: Secondary | ICD-10-CM

## 2021-07-10 DIAGNOSIS — R739 Hyperglycemia, unspecified: Secondary | ICD-10-CM

## 2021-07-10 DIAGNOSIS — E1165 Type 2 diabetes mellitus with hyperglycemia: Secondary | ICD-10-CM

## 2021-07-10 DIAGNOSIS — E119 Type 2 diabetes mellitus without complications: Secondary | ICD-10-CM

## 2021-07-10 DIAGNOSIS — Z7984 Long term (current) use of oral hypoglycemic drugs: Secondary | ICD-10-CM

## 2021-07-10 DIAGNOSIS — E1100 Type 2 diabetes mellitus with hyperosmolarity without nonketotic hyperglycemic-hyperosmolar coma (NKHHC): Principal | ICD-10-CM | POA: Diagnosis present

## 2021-07-10 DIAGNOSIS — Z9119 Patient's noncompliance with other medical treatment and regimen: Secondary | ICD-10-CM

## 2021-07-10 DIAGNOSIS — Z833 Family history of diabetes mellitus: Secondary | ICD-10-CM

## 2021-07-10 DIAGNOSIS — Z20822 Contact with and (suspected) exposure to covid-19: Secondary | ICD-10-CM | POA: Diagnosis present

## 2021-07-10 DIAGNOSIS — Z8249 Family history of ischemic heart disease and other diseases of the circulatory system: Secondary | ICD-10-CM

## 2021-07-10 DIAGNOSIS — Z880 Allergy status to penicillin: Secondary | ICD-10-CM

## 2021-07-10 DIAGNOSIS — I10 Essential (primary) hypertension: Secondary | ICD-10-CM | POA: Diagnosis present

## 2021-07-10 DIAGNOSIS — Z79899 Other long term (current) drug therapy: Secondary | ICD-10-CM

## 2021-07-10 LAB — CBC WITH DIFFERENTIAL/PLATELET
Abs Immature Granulocytes: 0.01 10*3/uL (ref 0.00–0.07)
Basophils Absolute: 0 10*3/uL (ref 0.0–0.1)
Basophils Relative: 0 %
Eosinophils Absolute: 0.1 10*3/uL (ref 0.0–0.5)
Eosinophils Relative: 1 %
HCT: 37.7 % — ABNORMAL LOW (ref 39.0–52.0)
Hemoglobin: 13 g/dL (ref 13.0–17.0)
Immature Granulocytes: 0 %
Lymphocytes Relative: 17 %
Lymphs Abs: 1 10*3/uL (ref 0.7–4.0)
MCH: 30.1 pg (ref 26.0–34.0)
MCHC: 34.5 g/dL (ref 30.0–36.0)
MCV: 87.3 fL (ref 80.0–100.0)
Monocytes Absolute: 0.4 10*3/uL (ref 0.1–1.0)
Monocytes Relative: 7 %
Neutro Abs: 4.2 10*3/uL (ref 1.7–7.7)
Neutrophils Relative %: 75 %
Platelets: 296 10*3/uL (ref 150–400)
RBC: 4.32 MIL/uL (ref 4.22–5.81)
RDW: 12 % (ref 11.5–15.5)
WBC: 5.6 10*3/uL (ref 4.0–10.5)
nRBC: 0 % (ref 0.0–0.2)

## 2021-07-10 LAB — BASIC METABOLIC PANEL
Anion gap: 7 (ref 5–15)
BUN: 11 mg/dL (ref 6–20)
CO2: 29 mmol/L (ref 22–32)
Calcium: 9.4 mg/dL (ref 8.9–10.3)
Chloride: 101 mmol/L (ref 98–111)
Creatinine, Ser: 0.94 mg/dL (ref 0.61–1.24)
GFR, Estimated: >60 mL/min (ref >60)
Glucose, Bld: 162 mg/dL — ABNORMAL HIGH (ref 70–99)
Potassium: 3.8 mmol/L (ref 3.5–5.1)
Sodium: 137 mmol/L (ref 135–145)

## 2021-07-10 LAB — URINALYSIS, ROUTINE W REFLEX MICROSCOPIC
Bilirubin Urine: NEGATIVE
Glucose, UA: >=500 mg/dL — AB
Hgb urine dipstick: NEGATIVE
Ketones, ur: NEGATIVE mg/dL
Leukocytes,Ua: NEGATIVE
Nitrite: NEGATIVE
Protein, ur: NEGATIVE mg/dL
Specific Gravity, Urine: 1.01 (ref 1.005–1.030)
pH: 7.5 (ref 5.0–8.0)

## 2021-07-10 LAB — URINALYSIS, MICROSCOPIC (REFLEX): RBC / HPF: NONE SEEN RBC/hpf (ref 0–5)

## 2021-07-10 LAB — BLOOD GAS, VENOUS
Acid-Base Excess: 2.6 mmol/L — ABNORMAL HIGH (ref 0.0–2.0)
Bicarbonate: 24.2 mmol/L (ref 20.0–28.0)
FIO2: 21
O2 Saturation: 19.5 %
Patient temperature: 36.8
pCO2, Ven: 50.8 mmHg (ref 44.0–60.0)
pH, Ven: 7.354 (ref 7.250–7.430)
pO2, Ven: <31 mmHg — CL (ref 32.0–45.0)

## 2021-07-10 LAB — HEMOGLOBIN A1C
Hgb A1c MFr Bld: 12.1 % — ABNORMAL HIGH (ref 4.8–5.6)
Mean Plasma Glucose: 300.57 mg/dL

## 2021-07-10 LAB — CBG MONITORING, ED
Glucose-Capillary: >600 mg/dL (ref 70–99)
Glucose-Capillary: >600 mg/dL (ref 70–99)
Glucose-Capillary: 526 mg/dL (ref 70–99)
Glucose-Capillary: 348 mg/dL — ABNORMAL HIGH (ref 70–99)
Glucose-Capillary: 212 mg/dL — ABNORMAL HIGH (ref 70–99)
Glucose-Capillary: 188 mg/dL — ABNORMAL HIGH (ref 70–99)

## 2021-07-10 LAB — RESP PANEL BY RT-PCR (FLU A&B, COVID) ARPGX2
Influenza A by PCR: NEGATIVE
Influenza B by PCR: NEGATIVE
SARS Coronavirus 2 by RT PCR: NEGATIVE

## 2021-07-10 LAB — COMPREHENSIVE METABOLIC PANEL
ALT: 16 U/L (ref 0–44)
AST: 13 U/L — ABNORMAL LOW (ref 15–41)
Albumin: 4.5 g/dL (ref 3.5–5.0)
Alkaline Phosphatase: 89 U/L (ref 38–126)
Anion gap: 9 (ref 5–15)
BUN: 15 mg/dL (ref 6–20)
CO2: 26 mmol/L (ref 22–32)
Calcium: 9.4 mg/dL (ref 8.9–10.3)
Chloride: 86 mmol/L — ABNORMAL LOW (ref 98–111)
Creatinine, Ser: 1.19 mg/dL (ref 0.61–1.24)
GFR, Estimated: >60 mL/min (ref >60)
Glucose, Bld: 1010 mg/dL (ref 70–99)
Potassium: 5.1 mmol/L (ref 3.5–5.1)
Sodium: 121 mmol/L — ABNORMAL LOW (ref 135–145)
Total Bilirubin: 0.9 mg/dL (ref 0.3–1.2)
Total Protein: 8 g/dL (ref 6.5–8.1)

## 2021-07-10 LAB — PHOSPHORUS: Phosphorus: 3.3 mg/dL (ref 2.5–4.6)

## 2021-07-10 LAB — MAGNESIUM: Magnesium: 2.3 mg/dL (ref 1.7–2.4)

## 2021-07-10 LAB — BETA-HYDROXYBUTYRIC ACID: Beta-Hydroxybutyric Acid: 0.46 mmol/L — ABNORMAL HIGH (ref 0.05–0.27)

## 2021-07-10 LAB — OSMOLALITY: Osmolality: 321 mOsm/kg (ref 275–295)

## 2021-07-10 MED ORDER — DEXTROSE IN LACTATED RINGERS 5 % IV SOLN: INTRAVENOUS | Status: DC

## 2021-07-10 MED ORDER — LACTATED RINGERS IV BOLUS
1000.0000 mL | INTRAVENOUS | Status: AC
  Administered 2021-07-10: 1000 mL via INTRAVENOUS

## 2021-07-10 MED ORDER — INSULIN REGULAR(HUMAN) IN NACL 100-0.9 UT/100ML-% IV SOLN
INTRAVENOUS | Status: DC
  Administered 2021-07-10: 4.2 [IU]/h via INTRAVENOUS

## 2021-07-10 MED ORDER — INSULIN REGULAR(HUMAN) IN NACL 100-0.9 UT/100ML-% IV SOLN
INTRAVENOUS | Status: DC
  Administered 2021-07-10: 7 [IU]/h via INTRAVENOUS
  Filled 2021-07-10: qty 100

## 2021-07-10 MED ORDER — LACTATED RINGERS IV SOLN: INTRAVENOUS | Status: DC

## 2021-07-10 MED ORDER — DEXTROSE 50 % IV SOLN: 0.0000 mL | INTRAVENOUS | Status: DC | PRN

## 2021-07-10 MED ORDER — INSULIN ASPART 100 UNIT/ML IJ SOLN
0.0000 [IU] | INTRAMUSCULAR | Status: DC
  Administered 2021-07-10: 3 [IU] via SUBCUTANEOUS
  Administered 2021-07-11: 5 [IU] via SUBCUTANEOUS
  Administered 2021-07-11: 2 [IU] via SUBCUTANEOUS
  Administered 2021-07-11: 8 [IU] via SUBCUTANEOUS
  Filled 2021-07-10 (×3): qty 1

## 2021-07-10 MED ORDER — LACTATED RINGERS IV BOLUS
2000.0000 mL | Freq: Once | INTRAVENOUS | Status: AC
  Administered 2021-07-10: 2000 mL via INTRAVENOUS

## 2021-07-10 MED ORDER — AMLODIPINE BESYLATE 5 MG PO TABS
5.0000 mg | ORAL_TABLET | Freq: Every day | ORAL | Status: DC
  Administered 2021-07-10: 5 mg via ORAL
  Filled 2021-07-10: qty 1

## 2021-07-10 MED ORDER — POTASSIUM CHLORIDE CRYS ER 20 MEQ PO TBCR
40.0000 meq | EXTENDED_RELEASE_TABLET | Freq: Once | ORAL | Status: AC
  Administered 2021-07-10: 40 meq via ORAL
  Filled 2021-07-10: qty 2

## 2021-07-10 MED ORDER — INSULIN GLARGINE-YFGN 100 UNIT/ML ~~LOC~~ SOLN: 10.0000 [IU] | Freq: Every day | SUBCUTANEOUS | Status: DC

## 2021-07-10 MED ORDER — ENOXAPARIN SODIUM 40 MG/0.4ML IJ SOSY
40.0000 mg | PREFILLED_SYRINGE | INTRAMUSCULAR | Status: DC
  Administered 2021-07-10: 40 mg via SUBCUTANEOUS
  Filled 2021-07-10: qty 0.4

## 2021-07-10 MED ORDER — INSULIN GLARGINE-YFGN 100 UNIT/ML ~~LOC~~ SOLN
8.0000 [IU] | Freq: Every day | SUBCUTANEOUS | Status: DC
  Administered 2021-07-10: 8 [IU] via SUBCUTANEOUS
  Filled 2021-07-10 (×3): qty 0.08

## 2021-07-10 NOTE — Progress Notes (Signed)
TOC consulted for medication assistance needs. CSW spoke with pt about medication needs, pt states he sometimes struggles with getting his medications. CSW spoke with pt about providing him with a MATCH voucher. CSW explained that this is only good for a 30 day supply and that he will not qualify for another one until next year.   Pt also inquiring about glucometer as he does not have one. CSW spoke with [facility] pharmacy to find out if there was a reduced cost glucometer. The pharmacy states that the best option for an individual without insurance is to buy the [employer] over the counter glucometer. Test strips are 5 dollars and the meter is 9 dollars.

## 2021-07-10 NOTE — H&P (Addendum)
History and Physical    Edward Roman:[id number] DOB: 1981/03/08 DOA: 07/10/2021  PCP: Patient, No Pcp Per (Inactive)   Patient coming from: Home  I have personally briefly reviewed patient's old medical records in [place]  Chief Complaint: Weakness  HPI: Edward Roman is a 40 y.o. male with medical history significant for  DM, HTN.  Patient presented to the ED with complaints of weakness over the past several days, with increased thirst and increased urination.  Patient is supposed to be on metformin and glipizide, he reports over the last 2 weeks at least, he has been taking his medications and skipping several days, trying to stretch prescription out, taking it once in 3 days.   Reports difficulty affording his medications, that his hours at work with cut down, so he is now working part-time. Patient lives in a hotel currently, does not have a primary care provider.   Per chart review , over the past year patient has had several ED visits to have his blood sugar checks, for hypoglycemia and for medication refill.  He otherwise denies any other complaints.  ED Course: Stable vitals.  Blood glucose 1010.  Anion gap of 9.  7 bicarb 26.  Sodium 121.  Venous pH 7.3.  Beta hydroxybutyrate 0.46. 2 L bolus given.  Insulin drip started.  Social work was consulted in the ED to assist with patient's medication needs.  Hospitalist to admit.  Review of Systems: As per HPI all other systems reviewed and negative.  Past Medical History:  Diagnosis Date   Diabetes mellitus without complication ([place])    Hypertension     History reviewed. No pertinent surgical history.   reports that he has never smoked. He has never used smokeless tobacco. He reports that he does not drink alcohol and does not use drugs.  Allergies  Allergen Reactions   Penicillin G Hives   Penicillins     Family History  Problem Relation Age of Onset   Hypertension Mother    Diabetes Mother     Hypertension Father    Diabetes Father    Hypertension Other    Diabetes Other    Thyroid disease Other     Prior to Admission medications   Medication Sig Start Date End Date Taking? Authorizing Provider  amLODipine (NORVASC) 5 MG tablet Take 1 tablet (5 mg total) by mouth daily. 01/18/21 07/17/21  Noemi Chapel, MD  blood glucose meter kit and supplies KIT Dispense based on patient and insurance preference. Use up to four times daily as directed. (FOR ICD-9 250.00, 250.01). 10/05/20   Davonna Belling, MD  cetirizine (ZYRTEC ALLERGY) 10 MG tablet Take 1 tablet (10 mg total) by mouth daily for 10 days. 09/01/19 09/11/19  Lannie Fields, PA-C  glipiZIDE (GLUCOTROL) 5 MG tablet Take 1 tablet (5 mg total) by mouth 2 (two) times daily before a meal. 01/18/21 04/18/21  Noemi Chapel, MD  metFORMIN (GLUCOPHAGE) 500 MG tablet Take 1 tablet (500 mg total) by mouth 2 (two) times daily with a meal. 01/18/21 04/18/21  Noemi Chapel, MD  pantoprazole (PROTONIX) 20 MG tablet Take 1 tablet (20 mg total) by mouth daily. 08/15/19 10/14/19  Harvest Dark, MD  hydrochlorothiazide (HYDRODIURIL) 25 MG tablet Take 1 tablet (25 mg total) by mouth daily. Patient not taking: Reported on 11/24/2019 10/08/18 10/05/20  Lily Kocher, PA-C    Physical Exam: Vitals:   07/10/21 1520 07/10/21 1527 07/10/21 1548  BP:  (!) 130/96 (!) 157/91  Pulse:  (!) 57 72  Resp:  20 18  Temp:  98.3 F (36.8 C)   TempSrc:  Oral   SpO2:  100% 98%  Weight: 77.1 kg    Height: 5' 9"  (1.753 m)      Constitutional: NAD, calm, comfortable Vitals:   07/10/21 1520 07/10/21 1527 07/10/21 1548  BP:  (!) 130/96 (!) 157/91  Pulse:  (!) 57 72  Resp:  20 18  Temp:  98.3 F (36.8 C)   TempSrc:  Oral   SpO2:  100% 98%  Weight: 77.1 kg    Height: 5' 9"  (1.753 m)     Eyes: PERRL, lids and conjunctivae normal ENMT: Mucous membranes are dry Neck: normal, supple, no masses, no thyromegaly Respiratory: clear to auscultation bilaterally,  no wheezing, no crackles. Normal respiratory effort. No accessory muscle use.  Cardiovascular: Regular rate and rhythm, no murmurs / rubs / gallops. No extremity edema. 2+ pedal pulses.   Abdomen: no tenderness, no masses palpated. No hepatosplenomegaly. Bowel sounds positive.  Musculoskeletal: no clubbing / cyanosis. No joint deformity upper and lower extremities. Good ROM, no contractures. Normal muscle tone.  Skin: no rashes, lesions, ulcers. No induration Neurologic: No apparent cranial nerve abnormality, moving extremities spontaneously. Psychiatric: Normal judgment and insight. Alert and oriented x 3. Normal mood.   Labs on Admission: I have personally reviewed following labs and imaging studies  CBC: Recent Labs  Lab 07/10/21 1558  WBC 5.6  NEUTROABS 4.2  HGB 13.0  HCT 37.7*  MCV 87.3  PLT 993   Basic Metabolic Panel: Recent Labs  Lab 07/10/21 1558  NA 121*  K 5.1  CL 86*  CO2 26  GLUCOSE 1,010*  BUN 15  CREATININE 1.19  CALCIUM 9.4   Liver Function Tests: Recent Labs  Lab 07/10/21 1558  AST 13*  ALT 16  ALKPHOS 89  BILITOT 0.9  PROT 8.0  ALBUMIN 4.5   CBG: Recent Labs  Lab 07/10/21 1519  GLUCAP >600*    Radiological Exams on Admission: No results found.  EKG: Independently reviewed.  None  Assessment/Plan Principal Problem:   Hyperosmolar hyperglycemic state (HHS) ([place]) Active Problems:   DM (diabetes mellitus) ([place])   HTN (hypertension)  Hyperosmolar hyperglycemic state in diabetic-blood sugar 1010.  Anion gap 9, serum bicarb 26 within normal limits.  Beta hydroxybutyrate acid 0.46 not suggestive of DKA.  pH 7.3.  Supposed to be on metformin and glipizide, has not been compliant due to financial challenges. -Serum osmolality ordered in ED, pending -Continue insulin drip - Total 3 L bolus Ringer's lactate given -Continue R/L 150 cc/hr  -Obtain hemoglobin A1c - NPO -Transition to sliding scale subcut insulin when blood sugars less than  250. -Diabetes coordinator consult -Addendum blood sugars in the 200s.  Patient wants to leave, I have told him why he needs to stay for more hydration, A1c still pending- need to determine if he needs more aggressive therapy than metformin and glipizide, and talk to [facility], and plan for out-patient follow-up.  He agreed to stay for now.  He will need to sign out AMA if he wants to leave. -Lantus 8 units daily, SSI- M q4h  Pseudohyponatremia-sodium 121, corrected sodium ~136. - Hydrate, insulin drip  Hypertension-blood pressure stable. -Resume Norvasc 5 mg daily  Social issues-unable to afford medications, has no primary care provider.  Currently staying in a hotel -TOC consult  DVT prophylaxis: Lovenox Code Status: Full code Family Communication: None at bedside. Disposition Plan: ~ 2 days  Consults called:  None Admission status: inpt, stepdown >> Med surg. I certify that at the point of admission it is my clinical judgment that the patient will require inpatient hospital care spanning beyond 2 midnights from the point of admission due to high intensity of service, high risk for further deterioration and high frequency of surveillance required.   Bethena Roys MD [facility]  07/10/2021, 9:38 PM

## 2021-07-10 NOTE — ED Provider Notes (Signed)
[facility] EMERGENCY DEPARTMENT Provider Note   CSN: [id number] Arrival date & time: 07/10/21  1513     History Chief Complaint  Patient presents with   Blood Sugar Problem    Edward Roman is a 40 y.o. male.  HPI  Patient is a 40 year old male with a history of diabetes mellitus who presents to the emergency department due to hyperglycemia.  Patient states for the past few days he has been feeling more fatigued and reports associated polydipsia.  Denies any chest pain, shortness of breath, abdominal pain, nausea, vomiting, diarrhea.  He states that due to a lack of funds he has not been taking his metformin and glipizide regularly.  Patient states he does not have a PCP.     Past Medical History:  Diagnosis Date   Diabetes mellitus without complication ([place])    Hypertension     There are no problems to display for this patient.   History reviewed. No pertinent surgical history.     Family History  Problem Relation Age of Onset   Hypertension Mother    Diabetes Mother    Hypertension Father    Diabetes Father    Hypertension Other    Diabetes Other    Thyroid disease Other     Social History   Tobacco Use   Smoking status: Never   Smokeless tobacco: Never  Vaping Use   Vaping Use: Never used  Substance Use Topics   Alcohol use: No   Drug use: No    Home Medications Prior to Admission medications   Medication Sig Start Date End Date Taking? Authorizing Provider  amLODipine (NORVASC) 5 MG tablet Take 1 tablet (5 mg total) by mouth daily. 01/18/21 07/17/21  Noemi Chapel, MD  blood glucose meter kit and supplies KIT Dispense based on patient and insurance preference. Use up to four times daily as directed. (FOR ICD-9 250.00, 250.01). 10/05/20   Davonna Belling, MD  cetirizine (ZYRTEC ALLERGY) 10 MG tablet Take 1 tablet (10 mg total) by mouth daily for 10 days. 09/01/19 09/11/19  Lannie Fields, PA-C  glipiZIDE (GLUCOTROL) 5 MG tablet Take 1 tablet (5 mg  total) by mouth 2 (two) times daily before a meal. 01/18/21 04/18/21  Noemi Chapel, MD  metFORMIN (GLUCOPHAGE) 500 MG tablet Take 1 tablet (500 mg total) by mouth 2 (two) times daily with a meal. 01/18/21 04/18/21  Noemi Chapel, MD  pantoprazole (PROTONIX) 20 MG tablet Take 1 tablet (20 mg total) by mouth daily. 08/15/19 10/14/19  Harvest Dark, MD  hydrochlorothiazide (HYDRODIURIL) 25 MG tablet Take 1 tablet (25 mg total) by mouth daily. Patient not taking: Reported on 11/24/2019 10/08/18 10/05/20  Lily Kocher, PA-C    Allergies    Penicillin g and Penicillins  Review of Systems   Review of Systems  All other systems reviewed and are negative. Ten systems reviewed and are negative for acute change, except as noted in the HPI.   Physical Exam Updated Vital Signs BP (!) 157/91 (BP Location: Right Arm)   Pulse 72   Temp 98.3 F (36.8 C) (Oral)   Resp 18   Ht 5' 9" (1.753 m)   Wt 77.1 kg   SpO2 98%   BMI 25.10 kg/m   Physical Exam Vitals and nursing note reviewed.  Constitutional:      General: He is not in acute distress.    Appearance: Normal appearance. He is not ill-appearing, toxic-appearing or diaphoretic.  HENT:     Head: Normocephalic  and atraumatic.     Right Ear: External ear normal.     Left Ear: External ear normal.     Nose: Nose normal.     Mouth/Throat:     Mouth: Mucous membranes are moist.     Pharynx: Oropharynx is clear. No oropharyngeal exudate or posterior oropharyngeal erythema.  Eyes:     Extraocular Movements: Extraocular movements intact.  Cardiovascular:     Rate and Rhythm: Normal rate and regular rhythm.     Pulses: Normal pulses.     Heart sounds: Normal heart sounds. No murmur heard.   No friction rub. No gallop.  Pulmonary:     Effort: Pulmonary effort is normal. No respiratory distress.     Breath sounds: Normal breath sounds. No stridor. No wheezing, rhonchi or rales.  Abdominal:     General: Abdomen is flat.     Tenderness: There  is no abdominal tenderness.  Musculoskeletal:        General: Normal range of motion.     Cervical back: Normal range of motion and neck supple. No tenderness.  Skin:    General: Skin is warm and dry.  Neurological:     General: No focal deficit present.     Mental Status: He is alert and oriented to person, place, and time.  Psychiatric:        Mood and Affect: Mood normal.        Behavior: Behavior normal.    ED Results / Procedures / Treatments   Labs (all labs ordered are listed, but only abnormal results are displayed) Labs Reviewed  COMPREHENSIVE METABOLIC PANEL - Abnormal; Notable for the following components:      Result Value   Sodium 121 (*)    Chloride 86 (*)    Glucose, Bld 1,010 (*)    AST 13 (*)    All other components within normal limits  CBC WITH DIFFERENTIAL/PLATELET - Abnormal; Notable for the following components:   HCT 37.7 (*)    All other components within normal limits  BLOOD GAS, VENOUS - Abnormal; Notable for the following components:   pO2, Ven <31.0 (*)    Acid-Base Excess 2.6 (*)    All other components within normal limits  BETA-HYDROXYBUTYRIC ACID - Abnormal; Notable for the following components:   Beta-Hydroxybutyric Acid 0.46 (*)    All other components within normal limits  CBG MONITORING, ED - Abnormal; Notable for the following components:   Glucose-Capillary >600 (*)    All other components within normal limits  URINALYSIS, ROUTINE W REFLEX MICROSCOPIC  OSMOLALITY   EKG None  Radiology No results found.  Procedures Procedures   Medications Ordered in ED Medications  insulin regular, human (MYXREDLIN) 100 units/ 100 mL infusion (has no administration in time range)  lactated ringers infusion (has no administration in time range)  dextrose 5 % in lactated ringers infusion (has no administration in time range)  dextrose 50 % solution 0-50 mL (has no administration in time range)  lactated ringers bolus 2,000 mL (2,000 mLs  Intravenous New Bag/Given 07/10/21 1606)   ED Course  I have reviewed the triage vital signs and the nursing notes.  Pertinent labs & imaging results that were available during my care of the patient were reviewed by me and considered in my medical decision making (see chart for details).  Clinical Course as of 07/10/21 1716  Wed Jul 10, 2021  1638 Patient discussed with and evaluated by the social work team.  Patient was   given a voucher for his medications that he can use to pick them up for free at the local [place].  He was also given money for a glucometer.  Lastly, patient was given information on how to find a PCP in the area. [LJ]    Clinical Course User Index [LJ] Rayna Sexton, PA-C   MDM Rules/Calculators/A&P                          Pt is a 40 y.o. male who presents to the emergency department due to fatigue/weakness.  Patient found to be hyperglycemic with a glucose of 1010.  Patient states that due to funding he has not been able to regularly take his glipizide or his metformin.  Labs: CBC with hematocrit of 37.7. CMP with a sodium of 121, chloride of 86, glucose of 1010.  Sodium corrected for hyperglycemia is 136. Beta hydroxybutyric acid of 0.46. VBG shows a PO2 less than 31. UA is pending.  I, Rayna Sexton, PA-C, personally reviewed and evaluated these images and lab results as part of my medical decision-making.  pH within normal limits at 7.354.  Beta hydroxybutyric acid mildly elevated at 0.46.  Patient reports weakness and fatigue but no nausea or vomiting.  A&O x3.  Does not appear to be in DKA at this time. Possibly HHS.  Serum osmolality is pending.  After further discussion patient notes that due to funds he has not been able to regularly take his metformin or his glipizide.  I consulted our transition of care team who has given the patient vouchers for his medications as well as funds for a glucometer.  Patient was also given resources to help find  a PCP in the area.  Given patient's profound hypoglycemia feel that admission is warranted.  This was discussed with him and he is amenable.  We will discussed with the medicine team.  Note: Portions of this report may have been transcribed using voice recognition software. Every effort was made to ensure accuracy; however, inadvertent computerized transcription errors may be present.   Final Clinical Impression(s) / ED Diagnoses Final diagnoses:  Hyperglycemia    Rx / DC Orders ED Discharge Orders     None        Rayna Sexton, PA-C 07/10/21 1717    Hayden Rasmussen, MD 07/11/21 1049

## 2021-07-10 NOTE — ED Triage Notes (Signed)
Pt to er, pt states that he has had dm for the past year, states that he takes metformin, states that he has no energy and feels week.  States that he doesn't have a pmd.

## 2021-07-10 NOTE — ED Notes (Signed)
Pt states he has been out of his Metformin "for awhile now". He has no glucometer to check his blood glucose level. States he has no PCP or insurance and is low on money because he needs to use it to take care of his daughter and buy food. He is currently residing in a hotel. SW consult has been made to help assist pt with needs.  Pt reports polyuria, polydipsia, generalized malaise for "awhile".

## 2021-07-11 LAB — BASIC METABOLIC PANEL
Anion gap: 9 (ref 5–15)
BUN: 10 mg/dL (ref 6–20)
CO2: 29 mmol/L (ref 22–32)
Calcium: 9.6 mg/dL (ref 8.9–10.3)
Chloride: 103 mmol/L (ref 98–111)
Creatinine, Ser: 0.94 mg/dL (ref 0.61–1.24)
GFR, Estimated: >60 mL/min (ref >60)
Glucose, Bld: 145 mg/dL — ABNORMAL HIGH (ref 70–99)
Potassium: 4.3 mmol/L (ref 3.5–5.1)
Sodium: 141 mmol/L (ref 135–145)

## 2021-07-11 LAB — CBG MONITORING, ED
Glucose-Capillary: 121 mg/dL — ABNORMAL HIGH (ref 70–99)
Glucose-Capillary: 216 mg/dL — ABNORMAL HIGH (ref 70–99)
Glucose-Capillary: 291 mg/dL — ABNORMAL HIGH (ref 70–99)
Glucose-Capillary: >600 mg/dL (ref 70–99)

## 2021-07-11 LAB — HIV ANTIBODY (ROUTINE TESTING W REFLEX): HIV Screen 4th Generation wRfx: NONREACTIVE

## 2021-07-11 MED ORDER — METFORMIN HCL 500 MG PO TABS: 1000.0000 mg | ORAL_TABLET | Freq: Two times a day (BID) | ORAL | 1 refills | Status: DC

## 2021-07-11 MED ORDER — GLIPIZIDE 5 MG PO TABS: 5.0000 mg | ORAL_TABLET | Freq: Two times a day (BID) | ORAL | 1 refills | Status: DC

## 2021-07-11 MED ORDER — AMLODIPINE BESYLATE 5 MG PO TABS: 5.0000 mg | ORAL_TABLET | Freq: Every day | ORAL | 1 refills | Status: DC

## 2021-07-11 NOTE — ED Notes (Signed)
Pt requesting to leave AMA. MD notified.

## 2021-07-11 NOTE — ED Notes (Signed)
MD at beside

## 2021-07-11 NOTE — ED Notes (Signed)
Pt  Had tv dinner and crackers before cbg

## 2021-07-11 NOTE — Discharge Summary (Signed)
DISCHARGE SUMMARY  SASAN WILKIE  MR#: [id number]  DOB:November 09, 1980  Date of Admission: 07/10/2021 Date of Discharge: 07/11/2021  Attending Physician:Gweneth Fredlund Hennie Duos, MD  Patient's XTK:[id number], No Pcp Per (Inactive)  Consults: none   Disposition: D/C home   Follow-up Appts:  Follow-up Information     [facility]. Schedule an appointment as soon as possible for a visit .   Contact information: [place] [id number] [place], [facility]. Schedule an appointment as soon as possible for a visit .   Contact information: [place] [address] [phone]                 Tests Needing Follow-up: -assess CBG control -assess BP control -assess compliance w/ meds and ability to acquire them   Discharge Diagnoses: Hyperosmolar hyperglycemic state Uncontrolled DM2 w/ severe hyperglycemia  Pseudohyponatremia HTN  Initial presentation: 40 year old with a history of poorly controlled DM and HTN who presented to the ED with severe progressive weakness over several days associated with increased thirst and increased urination.  The patient is prescribed metformin and glipizide but reported that he had been out for 2 weeks as he could not afford to continue to refill his prescriptions.  In the ED he was found to have a blood glucose of 1010.  Hospital Course: The patient required treatment with IV insulin.  His CBG improved as expected with this therapy while in the ED.  He was hydrated as well.  He tolerated all interventions without difficulty.  Social work was consulted and assisted the patient and assuring that he could obtain his medications, at least for his initial first 30 days post discharge.  The availability of relatively inexpensive versions of his medications as well as testing supplies at [facility] was explained to him.  The consequences of ongoing  severely uncontrolled diabetes were explained to him and it was impressed upon him that he absolutely must take control of his diabetes or he will suffer major medical issues and even possibly death.  He voiced understanding.  He is a hard-working individual who is trying his best to take care of his family but is facing significant financial challenges that are making this hard.  This makes it even harder for him to take care of himself.  He is discharged from the emergency room in stable condition.  Allergies as of 07/11/2021       Reactions   Penicillin G Hives   Penicillins         Medication List     TAKE these medications    amLODipine 5 MG tablet Commonly known as: NORVASC Take 1 tablet (5 mg total) by mouth daily.   blood glucose meter kit and supplies Kit Dispense based on patient and insurance preference. Use up to four times daily as directed. (FOR ICD-9 250.00, 250.01).   glipiZIDE 5 MG tablet Commonly known as: Glucotrol Take 1 tablet (5 mg total) by mouth 2 (two) times daily before a meal.   metFORMIN 500 MG tablet Commonly known as: Glucophage Take 2 tablets (1,000 mg total) by mouth 2 (two) times daily with a meal. What changed: how much to take        Day of Discharge BP 137/89 (BP Location: Left Arm)   Pulse 90   Temp 98.3 F (36.8 C) (Oral)   Resp 18   Ht _0  (1.753 m)  Wt 77.1 kg   SpO2 97%   BMI 25.10 kg/m   Physical Exam: General: No acute respiratory distress Lungs: Clear to auscultation bilaterally without wheezes or crackles Cardiovascular: Regular rate and rhythm without murmur gallop or rub normal S1 and S2 Abdomen: Nontender, nondistended, soft, bowel sounds positive, no rebound, no ascites, no appreciable mass Extremities: No significant cyanosis, clubbing, or edema bilateral lower extremities  Basic Metabolic Panel: Recent Labs  Lab 07/10/21 1558 07/10/21 2034 07/11/21 0450  NA 121* 137 141  K 5.1 3.8 4.3  CL 86* 101 103   CO2 _0 GLUCOSE 1,010* 162* 145*  BUN _1 CREATININE 1.19 0.94 0.94  CALCIUM 9.4 9.4 9.6  MG 2.3  --   --   PHOS 3.3  --   --     Liver Function Tests: Recent Labs  Lab 07/10/21 1558  AST 13*  ALT 16  ALKPHOS 89  BILITOT 0.9  PROT 8.0  ALBUMIN 4.5    CBC: Recent Labs  Lab 07/10/21 1558  WBC 5.6  NEUTROABS 4.2  HGB 13.0  HCT 37.7*  MCV 87.3  PLT 296    CBG: Recent Labs  Lab 07/10/21 2113 07/10/21 2152 07/11/21 0014 07/11/21 0444 07/11/21 0843  GLUCAP 212* 188* 291* 121* 216*    Recent Results (from the past 240 hour(s))  Resp Panel by RT-PCR (Flu A&B, Covid) Nasopharyngeal Swab     Status: None   Collection Time: 07/10/21  6:50 PM   Specimen: Nasopharyngeal Swab; Nasopharyngeal(NP) swabs in vial transport medium  Result Value Ref Range Status   SARS Coronavirus 2 by RT PCR NEGATIVE NEGATIVE Final    Comment: (NOTE) SARS-CoV-2 target nucleic acids are NOT DETECTED.  The SARS-CoV-2 RNA is generally detectable in upper respiratory specimens during the acute phase of infection. The lowest concentration of SARS-CoV-2 viral copies this assay can detect is 138 copies/mL. A negative result does not preclude SARS-Cov-2 infection and should not be used as the sole basis for treatment or other patient management decisions. A negative result may occur with  improper specimen collection/handling, submission of specimen other than nasopharyngeal swab, presence of viral mutation(s) within the areas targeted by this assay, and inadequate number of viral copies(<138 copies/mL). A negative result must be combined with clinical observations, patient history, and epidemiological information. The expected result is Negative.  Fact Sheet for Patients:  EntrepreneurPulse.com.au  Fact Sheet for Healthcare Providers:  IncredibleEmployment.be  This test is no t yet approved or cleared by the [place] FDA and  has been  authorized for detection and/or diagnosis of SARS-CoV-2 by FDA under an Emergency Use Authorization (EUA). This EUA will remain  in effect (meaning this test can be used) for the duration of the COVID-19 declaration under Section 564(b)(1) of the Act, 21 U.S.C.section 360bbb-3(b)(1), unless the authorization is terminated  or revoked sooner.       Influenza A by PCR NEGATIVE NEGATIVE Final   Influenza B by PCR NEGATIVE NEGATIVE Final    Comment: (NOTE) The Xpert Xpress SARS-CoV-2/FLU/RSV plus assay is intended as an aid in the diagnosis of influenza from Nasopharyngeal swab specimens and should not be used as a sole basis for treatment. Nasal washings and aspirates are unacceptable for Xpert Xpress SARS-CoV-2/FLU/RSV testing.  Fact Sheet for Patients: EntrepreneurPulse.com.au  Fact Sheet for Healthcare Providers: IncredibleEmployment.be  This test is not yet approved or cleared by the [place] FDA and has been authorized for detection and/or diagnosis of  SARS-CoV-2 by FDA under an Emergency Use Authorization (EUA). This EUA will remain in effect (meaning this test can be used) for the duration of the COVID-19 declaration under Section 564(b)(1) of the Act, 21 U.S.C. section 360bbb-3(b)(1), unless the authorization is terminated or revoked.  Performed at [facility], [address] [place],  [address]       Time spent in discharge (includes decision making & examination of pt): 30 minutes  07/11/2021, 9:46 AM   Cherene Altes, MD [facility] Office  [phone]

## 2021-07-18 DIAGNOSIS — Z139 Encounter for screening, unspecified: Secondary | ICD-10-CM

## 2021-07-18 LAB — GLUCOSE, POCT (MANUAL RESULT ENTRY): POC Glucose: 163 mg/dl — AB (ref 70–99)

## 2021-07-18 NOTE — Congregational Nurse Program (Signed)
Pt attended [facility] to receive assistance with getting enrolled in the Care Connect Uninsured Program.    States that they last saw a medical provider with [facility]  ON 07/09/2021 due to elevated blood sugar and lack of medications at the time.   States he is currently managing his blood sugar since being released from the hospital.  States he is checking his BS at least 2 times a day with a glucometer that he has at home and recently purchased.    States he is taking his medications daily as advised for BP and Diabetes.   States he is  incorporating exercise , drinking more water and attempting to change his diet  and portion sizes to better regulate his blood sugar  Pt chief complaint(s) for wanting to access a medical provider is to be able to establish care with PCP to assist him with disease management of his Diabetes and BP.   In addition he states he is having to some blurred vision.  States he does have glasses but does not help eliminate the blurred vision.    Admits to only medical hx of diabetes (takes current medications: metformin, glipizide); hypertenison (amlopodine); acid reflux (only takes OTC meds when needed)   Vitals Signs Checked (see vitals section);  BS Glucose Check (2 or more hours last ate or drink) ABN:  163 (at clinic visit);   States he took in the a.m.  at home after eating on today and it was 198.     Plan Completed on Today:   -[facility] Enrollment/Eligibility completed Leah S. (CHW/Enrollment)  _Provided diabetes education on best time to check blood sugars, provided a handout on "know your numbers" and what you are eating that could affect elevated blood sugar numbers; Reviewed symptoms of when to identify when blood sugar is high or low and what actions that can be taken    -Referral sent to RN Nurse Case Manager, Norval Gable, for initial follow up and continous medical case management after completion of first appointment with the  [facility].    -Socio-determinant needs at this time identified NONE that are urgent.    -Scheduled initial  PCP appointment with the [facility] [place] (Wed) Sept 21, 2022 at 1 pm.   Instructions regarding appointment details were provided (i.e. bring medicine containers, cost of visit, arrival, etc)

## 2021-07-24 ENCOUNTER — Encounter: Payer: Self-pay | Admitting: Physician Assistant

## 2021-07-24 ENCOUNTER — Other Ambulatory Visit: Payer: Self-pay

## 2021-07-24 ENCOUNTER — Ambulatory Visit: Payer: Self-pay | Admitting: Physician Assistant

## 2021-07-24 VITALS — BP 120/88 | HR 85 | Temp 98.0°F | Ht 67.5 in | Wt 173.0 lb

## 2021-07-24 DIAGNOSIS — I10 Essential (primary) hypertension: Secondary | ICD-10-CM

## 2021-07-24 DIAGNOSIS — E1165 Type 2 diabetes mellitus with hyperglycemia: Secondary | ICD-10-CM

## 2021-07-24 DIAGNOSIS — Z7689 Persons encountering health services in other specified circumstances: Secondary | ICD-10-CM

## 2021-07-24 MED ORDER — LOSARTAN POTASSIUM 50 MG PO TABS: 50.0000 mg | ORAL_TABLET | Freq: Every day | ORAL | 1 refills | Status: DC

## 2021-07-24 MED ORDER — METFORMIN HCL 1000 MG PO TABS: 1000.0000 mg | ORAL_TABLET | Freq: Two times a day (BID) | ORAL | 1 refills | Status: DC

## 2021-07-24 MED ORDER — GLIPIZIDE 10 MG PO TABS: 10.0000 mg | ORAL_TABLET | Freq: Two times a day (BID) | ORAL | 1 refills | Status: DC

## 2021-07-24 NOTE — Progress Notes (Signed)
BP 120/88   Pulse 85   Temp 98 F (36.7 C)   Ht 5' 7.5" (1.715 m)   Wt 173 lb (78.5 kg)   SpO2 96%   BMI 26.70 kg/m    Subjective:    Patient ID: Edward Roman, male    DOB: [birth year], 40 y.o.   MRN: [id number]  HPI: Edward Roman is a 40 y.o. male presenting on 07/24/2021 for New Patient (Initial Visit)   HPI   Pt had a negative covid 19 screening questionnaire.  Chief Complaint  Patient presents with   New Patient (Initial Visit)    Pt says he has not had a  PCP in long time  Pt used to work at [employer] but he quit because he didn't like the hours and didn't think he was getting paid enough.   A1c 12.1 on 07/10/21 in the ER  Pt says he was diagnosed with DM about a year ago but labs in epic go back to 2020 and those indicate diabetes was present at that time (glucose 227).     Pt had eye appt today but is going to reschedule because he had appointment here.  He is feeling good  Review of his medications- he is not taking his metformin as prescribed; he is prescribed to take 1g bid but he is actually taking 500mg  qd.  He is not taking and ACEI or and ARB.       Relevant past medical, surgical, family and social history reviewed and updated as indicated. Interim medical history since our last visit reviewed. Allergies and medications reviewed and updated.    Current Outpatient Medications:    amLODipine (NORVASC) 5 MG tablet, Take 1 tablet (5 mg total) by mouth daily., Disp: 90 tablet, Rfl: 1   glipiZIDE (GLUCOTROL) 5 MG tablet, Take 1 tablet (5 mg total) by mouth 2 (two) times daily before a meal., Disp: 180 tablet, Rfl: 1   metFORMIN (GLUCOPHAGE) 500 MG tablet, Take 2 tablets (1,000 mg total) by mouth 2 (two) times daily with a meal., Disp: 360 tablet, Rfl: 1     Review of Systems  Per HPI unless specifically indicated above     Objective:    BP 120/88   Pulse 85   Temp 98 F (36.7 C)   Ht 5' 7.5" (1.715 m)   Wt 173 lb (78.5 kg)   SpO2  96%   BMI 26.70 kg/m   Wt Readings from Last 3 Encounters:  07/24/21 173 lb (78.5 kg)  07/10/21 170 lb (77.1 kg)  05/18/21 180 lb 12.4 oz (82 kg)    Physical Exam Vitals reviewed.  Constitutional:      General: He is not in acute distress.    Appearance: He is well-developed. He is not ill-appearing.  HENT:     Head: Normocephalic and atraumatic.     Right Ear: Tympanic membrane, ear canal and external ear normal.     Left Ear: Tympanic membrane, ear canal and external ear normal.  Eyes:     Extraocular Movements: Extraocular movements intact.     Conjunctiva/sclera: Conjunctivae normal.     Pupils: Pupils are equal, round, and reactive to light.  Neck:     Thyroid: No thyromegaly.  Cardiovascular:     Rate and Rhythm: Normal rate and regular rhythm.  Pulmonary:     Effort: Pulmonary effort is normal.     Breath sounds: Normal breath sounds. No wheezing or rales.  Abdominal:  General: Bowel sounds are normal.     Palpations: Abdomen is soft. There is no mass.     Tenderness: There is no abdominal tenderness.  Musculoskeletal:     Cervical back: Neck supple.     Right lower leg: No edema.     Left lower leg: No edema.  Lymphadenopathy:     Cervical: No cervical adenopathy.  Skin:    General: Skin is warm and dry.     Findings: No rash.  Neurological:     Mental Status: He is alert and oriented to person, place, and time.  Psychiatric:        Behavior: Behavior normal.           Assessment & Plan:    Encounter Diagnoses  Name Primary?   Encounter to establish care Yes   Uncontrolled type 2 diabetes mellitus with hyperglycemia (HCC)    Primary hypertension     -check labs- Lipids microalbumin, c-peptide (due to glucose > 1000 in September) -DM foot exam completed.  Pt was counseled on diabetic foot care and was given reading information on foot care -Pt was counseled to take his metformin bid.  Will increase his glipizide.  -Will change his bp  medication to ARB -pt is given book from BD on diabetes- Getting Started -pt is educated and encouraged to get Covid vaccination -pt to follow up 1 month.  He is to contact office sooner prn

## 2021-07-24 NOTE — Patient Instructions (Signed)

## 2021-08-05 ENCOUNTER — Ambulatory Visit
Admission: EM | Admit: 2021-08-05 | Discharge: 2021-08-05 | Disposition: A | Discharge status: Home / Self Care | Payer: MEDICAID

## 2021-08-05 ENCOUNTER — Other Ambulatory Visit: Payer: Self-pay

## 2021-08-07 ENCOUNTER — Telehealth: Payer: Self-pay

## 2021-08-07 NOTE — Telephone Encounter (Signed)
Called for follow up after [facility] appointment and also noted that client LWBS at urgent care of 08/05/21. Calling for follow up.  No answer, left voicemail requesting return call.

## 2021-08-22 ENCOUNTER — Ambulatory Visit: Payer: Self-pay | Admitting: Physician Assistant

## 2021-09-10 ENCOUNTER — Emergency Department (HOSPITAL_COMMUNITY)
Admission: EM | Admit: 2021-09-10 | Discharge: 2021-09-10 | Disposition: A | Discharge status: Home / Self Care | Payer: No Typology Code available for payment source | Attending: Emergency Medicine | Admitting: Emergency Medicine

## 2021-09-10 ENCOUNTER — Encounter (HOSPITAL_COMMUNITY): Payer: Self-pay | Admitting: *Deleted

## 2021-09-10 DIAGNOSIS — Z79899 Other long term (current) drug therapy: Secondary | ICD-10-CM | POA: Diagnosis not present

## 2021-09-10 DIAGNOSIS — Y990 Civilian activity done for income or pay: Secondary | ICD-10-CM | POA: Insufficient documentation

## 2021-09-10 DIAGNOSIS — I10 Essential (primary) hypertension: Secondary | ICD-10-CM | POA: Insufficient documentation

## 2021-09-10 DIAGNOSIS — Y9289 Other specified places as the place of occurrence of the external cause: Secondary | ICD-10-CM | POA: Diagnosis not present

## 2021-09-10 DIAGNOSIS — E119 Type 2 diabetes mellitus without complications: Secondary | ICD-10-CM | POA: Insufficient documentation

## 2021-09-10 DIAGNOSIS — Y9389 Activity, other specified: Secondary | ICD-10-CM | POA: Diagnosis not present

## 2021-09-10 DIAGNOSIS — Z7984 Long term (current) use of oral hypoglycemic drugs: Secondary | ICD-10-CM | POA: Diagnosis not present

## 2021-09-10 DIAGNOSIS — W278XXA Contact with other nonpowered hand tool, initial encounter: Secondary | ICD-10-CM | POA: Insufficient documentation

## 2021-09-10 DIAGNOSIS — S61212A Laceration without foreign body of right middle finger without damage to nail, initial encounter: Secondary | ICD-10-CM | POA: Insufficient documentation

## 2021-09-10 DIAGNOSIS — S60942A Unspecified superficial injury of right middle finger, initial encounter: Secondary | ICD-10-CM | POA: Diagnosis present

## 2021-09-10 MED ORDER — LIDOCAINE HCL (PF) 1 % IJ SOLN
INTRAMUSCULAR | Status: AC
  Administered 2021-09-10: 5 mL via INTRADERMAL
  Filled 2021-09-10: qty 5

## 2021-09-10 MED ORDER — LIDOCAINE HCL (PF) 1 % IJ SOLN: 5.0000 mL | Freq: Once | INTRAMUSCULAR | Status: AC

## 2021-09-10 NOTE — Discharge Instructions (Signed)
Keep the finger clean and bandaged.  Clean the wound with mild soap and water.  Sutures out in 8 to 10 days.  Return to the emergency department for any new or worsening symptoms or signs of infection such as increasing pain, red streaking fever or chills.

## 2021-09-10 NOTE — ED Notes (Signed)
Dressing and splint applied to middle finger

## 2021-09-10 NOTE — ED Triage Notes (Signed)
Laceration tip of middle finger, denies pain, work acident

## 2021-09-10 NOTE — ED Provider Notes (Signed)
[facility] EMERGENCY DEPARTMENT Provider Note   CSN: [id number] Arrival date & time: 09/10/21  1220     History Chief Complaint  Patient presents with   Laceration    Edward Roman is a 40 y.o. male.   Laceration Associated symptoms: no fever and no rash        Edward Roman is a 40 y.o. male who presents to the Emergency Department complaining of laceration to distal right middle finger.  This is a work related injury.  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  Wearing heavy work gloves at the time.  Minimal bleeding of his finger.  He denies numbness, pain and swelling of the finger.  Last Td several months ago.     Past Medical History:  Diagnosis Date   Diabetes mellitus without complication (HCC)    Hypertension     Patient Active Problem List   Diagnosis Date Noted   DM (diabetes mellitus) (HCC) 07/10/2021   HTN (hypertension) 07/10/2021    History reviewed. No pertinent surgical history.     Family History  Problem Relation Age of Onset   Hypertension Mother    Diabetes Mother    Cancer Father    Hypertension Father    Diabetes Father    Hypertension Other    Diabetes Other    Thyroid disease Other     Social History   Tobacco Use   Smoking status: Never   Smokeless tobacco: Never  Vaping Use   Vaping Use: Never used  Substance Use Topics   Alcohol use: Not Currently   Drug use: No    Home Medications Prior to Admission medications   Medication Sig Start Date End Date Taking? Authorizing Provider  amLODipine (NORVASC) 5 MG tablet Take 5 mg by mouth daily.   Yes [provider]  glipiZIDE (GLUCOTROL) 10 MG tablet Take 1 tablet (10 mg total) by mouth 2 (two) times daily before a meal. 07/24/21  Yes Jacquelin Hawking, PA-C  metFORMIN (GLUCOPHAGE) 1000 MG tablet Take 1 tablet (1,000 mg total) by mouth 2 (two) times daily with a meal. 07/24/21  Yes Jacquelin Hawking, PA-C  losartan (COZAAR) 50 MG  tablet Take 1 tablet (50 mg total) by mouth daily. Patient not taking: Reported on 09/10/2021 07/24/21   Jacquelin Hawking, PA-C  hydrochlorothiazide (HYDRODIURIL) 25 MG tablet Take 1 tablet (25 mg total) by mouth daily. Patient not taking: Reported on 11/24/2019 10/08/18 10/05/20  Ivery Quale, PA-C    Allergies    Penicillin g  Review of Systems   Review of Systems  Constitutional:  Negative for chills and fever.  Gastrointestinal:  Negative for nausea and vomiting.  Musculoskeletal:  Negative for arthralgias, back pain and myalgias.  Skin:  Positive for wound (laceration right middle finger). Negative for rash.  Neurological:  Negative for weakness and numbness.  Hematological:  Does not bruise/bleed easily.   Physical Exam Updated Vital Signs BP (!) 137/105   Pulse 89   Temp 97.8 F (36.6 C) (Oral)   Resp 20   Ht 5\' 6"  (1.676 m)   Wt 84.9 kg   SpO2 98%   BMI 30.21 kg/m   Physical Exam Vitals and nursing note reviewed.  Constitutional:      General: He is not in acute distress.    Appearance: Normal appearance.  Cardiovascular:     Rate and Rhythm: Normal rate and regular rhythm.     Pulses: Normal pulses.  Pulmonary:  Effort: Pulmonary effort is normal.  Musculoskeletal:        General: Signs of injury present. No swelling, tenderness or deformity. Normal range of motion.     Right hand: Laceration present. No swelling, tenderness or bony tenderness. Normal range of motion. Normal strength. Normal sensation. Normal capillary refill. Normal pulse.     Comments: 2 cm laceration to distal tip of the right middle finger.  No nail involvement, FB or edema.  Pt has FROM of the PIP and DIP joint.    Skin:    Capillary Refill: Capillary refill takes less than 2 seconds.  Neurological:     General: No focal deficit present.     Mental Status: He is alert.     Sensory: No sensory deficit.     Motor: No weakness.    ED Results / Procedures / Treatments   Labs (all labs  ordered are listed, but only abnormal results are displayed) Labs Reviewed - No data to display  EKG None  Radiology No results found.  Procedures Procedures    LACERATION REPAIR Performed by: Taveon Enyeart Authorized by: Charity Tessier Consent: Verbal consent obtained. Risks and benefits: risks, benefits and alternatives were discussed Consent given by: patient Patient identity confirmed: provided demographic data Prepped and Draped in normal sterile fashion Wound explored  Laceration Location: right middle finger  Laceration Length: 2 cm  No Foreign Bodies seen or palpated  Anesthesia: local infiltration  Local anesthetic: lidocaine 1%  w/o epinephrine  Anesthetic total: 2 ml  Irrigation method: syringe Amount of cleaning: standard  Skin closure: 4-0 Ethilon  Number of sutures: 4  Technique: simple interrupted  Patient tolerance: Patient tolerated the procedure well with no immediate complications.  Medications Ordered in ED Medications  lidocaine (PF) (XYLOCAINE) 1 % injection 5 mL (5 mLs Intradermal Given 09/10/21 1309)    ED Course  I have reviewed the triage vital signs and the nursing notes.  Pertinent labs & imaging results that were available during my care of the patient were reviewed by me and considered in my medical decision making (see chart for details).    MDM Rules/Calculators/A&P                           Patient here for evaluation of laceration to the distal right middle finger.  This is a work-related injury.  Laceration appears superficial and bleeding is controlled.  Neurovascularly intact.  Td is up-to-date. No indication for abx  Wound explored through range of motion and depth.  No foreign body seen.  Wound well approximated with sutures.  Patient tolerated procedure well.  Wound care instructions discussed, sutures out in 8 to 10 days.   Final Clinical Impression(s) / ED Diagnoses Final diagnoses:  Laceration of right  middle finger without foreign body without damage to nail, initial encounter    Rx / DC Orders ED Discharge Orders     None        Pauline Aus, PA-C 09/10/21 2226    Pricilla Loveless, MD 09/12/21 [phone]

## 2021-09-11 ENCOUNTER — Ambulatory Visit: Payer: Worker's Compensation | Admitting: Physician Assistant

## 2021-09-17 ENCOUNTER — Other Ambulatory Visit (HOSPITAL_COMMUNITY)
Admission: RE | Admit: 2021-09-17 | Discharge: 2021-09-17 | Disposition: A | Discharge status: Home / Self Care | Payer: Self-pay | Source: Ambulatory Visit | Attending: Physician Assistant | Admitting: Physician Assistant

## 2021-09-17 DIAGNOSIS — I10 Essential (primary) hypertension: Secondary | ICD-10-CM | POA: Insufficient documentation

## 2021-09-17 DIAGNOSIS — E1165 Type 2 diabetes mellitus with hyperglycemia: Secondary | ICD-10-CM | POA: Insufficient documentation

## 2021-09-17 LAB — LIPID PANEL
Cholesterol: 201 mg/dL — ABNORMAL HIGH (ref 0–200)
HDL: 54 mg/dL (ref >40)
LDL Cholesterol: 118 mg/dL — ABNORMAL HIGH (ref 0–99)
Total CHOL/HDL Ratio: 3.7 RATIO
Triglycerides: 143 mg/dL (ref <150)
VLDL: 29 mg/dL (ref 0–40)

## 2021-09-18 ENCOUNTER — Ambulatory Visit: Payer: Worker's Compensation | Admitting: Physician Assistant

## 2021-09-18 ENCOUNTER — Encounter: Payer: Self-pay | Admitting: Physician Assistant

## 2021-09-18 VITALS — BP 129/97 | HR 93 | Temp 97.3°F | Wt 190.0 lb

## 2021-09-18 DIAGNOSIS — E785 Hyperlipidemia, unspecified: Secondary | ICD-10-CM

## 2021-09-18 DIAGNOSIS — Z4802 Encounter for removal of sutures: Secondary | ICD-10-CM

## 2021-09-18 DIAGNOSIS — I10 Essential (primary) hypertension: Secondary | ICD-10-CM

## 2021-09-18 DIAGNOSIS — E1169 Type 2 diabetes mellitus with other specified complication: Secondary | ICD-10-CM

## 2021-09-18 DIAGNOSIS — E1165 Type 2 diabetes mellitus with hyperglycemia: Secondary | ICD-10-CM

## 2021-09-18 LAB — MICROALBUMIN, URINE: Microalb, Ur: 161.7 ug/mL — ABNORMAL HIGH

## 2021-09-18 LAB — C-PEPTIDE: C-Peptide: 2.6 ng/mL (ref 1.1–4.4)

## 2021-09-18 LAB — GLUCOSE, POCT (MANUAL RESULT ENTRY): POC Glucose: 83 mg/dl (ref 70–99)

## 2021-09-18 MED ORDER — LOSARTAN POTASSIUM 50 MG PO TABS: 50.0000 mg | ORAL_TABLET | Freq: Every day | ORAL | 1 refills | Status: DC

## 2021-09-18 MED ORDER — ATORVASTATIN CALCIUM 20 MG PO TABS: 20.0000 mg | ORAL_TABLET | Freq: Every day | ORAL | 1 refills | Status: DC

## 2021-09-18 MED ORDER — METFORMIN HCL 500 MG PO TABS: 500.0000 mg | ORAL_TABLET | Freq: Two times a day (BID) | ORAL | 1 refills | Status: DC

## 2021-09-18 NOTE — Progress Notes (Signed)
BP (!) 129/97   Pulse 93   Temp (!) 97.3 F (36.3 C)   Wt 190 lb (86.2 kg)   SpO2 93%   BMI 30.67 kg/m    Subjective:    Patient ID: Edward Roman, male    DOB: 07/15/1981, 40 y.o.   MRN: [id number]  HPI: TAIVON HAROON is a 40 y.o. male presenting on 09/18/2021 for Diabetes and Hypertension   HPI   .cc Chief Complaint  Patient presents with   Diabetes   Hypertension    Pt says he feels well.  He says he is using some metformin some days but isn't sure what dose.  He thinks it might be 500mg .  Pt got sutures in finger in ER on 09/10/21.  He says it has been doing well.  Relevant past medical, surgical, family and social history reviewed and updated as indicated. Interim medical history since our last visit reviewed. Allergies and medications reviewed and updated.  Current meds Metformin- unkown dose   Review of Systems  Per HPI unless specifically indicated above     Objective:    BP (!) 129/97   Pulse 93   Temp (!) 97.3 F (36.3 C)   Wt 190 lb (86.2 kg)   SpO2 93%   BMI 30.67 kg/m   Wt Readings from Last 3 Encounters:  09/18/21 190 lb (86.2 kg)  09/10/21 187 lb 2.7 oz (84.9 kg)  07/24/21 173 lb (78.5 kg)    Physical Exam Vitals reviewed.  Constitutional:      General: He is not in acute distress.    Appearance: He is well-developed. He is not ill-appearing.  HENT:     Head: Normocephalic and atraumatic.  Cardiovascular:     Rate and Rhythm: Normal rate and regular rhythm.  Pulmonary:     Effort: Pulmonary effort is normal.     Breath sounds: Normal breath sounds. No wheezing.  Abdominal:     General: Bowel sounds are normal.     Palpations: Abdomen is soft.     Tenderness: There is no abdominal tenderness.  Musculoskeletal:     Cervical back: Neck supple.     Right lower leg: No edema.     Left lower leg: No edema.     Comments: Sutures right middle finger with wound healing and no signs infection  Lymphadenopathy:     Cervical: No  cervical adenopathy.  Skin:    General: Skin is warm and dry.  Neurological:     Mental Status: He is alert and oriented to person, place, and time.  Psychiatric:        Behavior: Behavior normal.    Results for orders placed or performed during the hospital encounter of 09/17/21  C-peptide  Result Value Ref Range   C-Peptide 2.6 1.1 - 4.4 ng/mL  Lipid panel  Result Value Ref Range   Cholesterol 201 (H) 0 - 200 mg/dL   Triglycerides 09/19/21 631 mg/dL   HDL 54 <497 mg/dL   Total CHOL/HDL Ratio 3.7 RATIO   VLDL 29 0 - 40 mg/dL   LDL Cholesterol >02 (H) 0 - 99 mg/dL   637   C5Y   (85.0)  FBS 83     Assessment & Plan:    Encounter Diagnoses  Name Primary?   Uncontrolled type 2 diabetes mellitus with hyperglycemia (HCC) Yes   Primary hypertension    Hyperlipidemia associated with type 2 diabetes mellitus (HCC)    Visit for suture removal       -  reviewed labs with pt -sutures removed by PA -discussed high a1c and normal fbs today.  May be due to better eating habits.  Pt will be prescribed metformin 500mg  bid.  He is encouraged to watch diabetic diet.  He is encouraged to avoid etoh -start atorvastatin for lipids -losartan for BP -pt will be referred for DM eye exam -pt is educated and encouraged to get covid vaccination -pt to follow up 3 months.  He is to contact office sooner prn

## 2021-09-18 NOTE — Patient Instructions (Addendum)
[facility]    [phone] (to update your phone number)  ----------------------------------------------------------------------------  Diabetes Mellitus and Nutrition, Adult When you have diabetes, or diabetes mellitus, it is very important to have healthy eating habits because your blood sugar (glucose) levels are greatly affected by what you eat and drink. Eating healthy foods in the right amounts, at about the same times every day, can help you: Manage your blood glucose. Lower your risk of heart disease. Improve your blood pressure. Reach or maintain a healthy weight. What can affect my meal plan? Every person with diabetes is different, and each person has different needs for a meal plan. Your health care provider may recommend that you work with a dietitian to make a meal plan that is best for you. Your meal plan may vary depending on factors such as: The calories you need. The medicines you take. Your weight. Your blood glucose, blood pressure, and cholesterol levels. Your activity level. Other health conditions you have, such as heart or kidney disease. How do carbohydrates affect me? Carbohydrates, also called carbs, affect your blood glucose level more than any other type of food. Eating carbs raises the amount of glucose in your blood. It is important to know how many carbs you can safely have in each meal. This is different for every person. Your dietitian can help you calculate how many carbs you should have at each meal and for each snack. How does alcohol affect me? Alcohol can cause a decrease in blood glucose (hypoglycemia), especially if you use insulin or take certain diabetes medicines by mouth. Hypoglycemia can be a life-threatening condition. Symptoms of hypoglycemia, such as sleepiness, dizziness, and confusion, are similar to symptoms of having too much alcohol. Do not drink alcohol if: Your health care provider tells you not to drink. You are pregnant, may be  pregnant, or are planning to become pregnant. If you drink alcohol: Limit how much you have to: 0-1 drink a day for women. 0-2 drinks a day for men. Know how much alcohol is in your drink. In the [place] one drink equals one 12 oz bottle of beer (355 mL), one 5 oz glass of wine (148 mL), or one 1 oz glass of hard liquor (44 mL). Keep yourself hydrated with water, diet soda, or unsweetened iced tea. Keep in mind that regular soda, juice, and other mixers may contain a lot of sugar and must be counted as carbs. What are tips for following this plan? Reading food labels Start by checking the serving size on the Nutrition Facts label of packaged foods and drinks. The number of calories and the amount of carbs, fats, and other nutrients listed on the label are based on one serving of the item. Many items contain more than one serving per package. Check the total grams (g) of carbs in one serving. Check the number of grams of saturated fats and trans fats in one serving. Choose foods that have a low amount or none of these fats. Check the number of milligrams (mg) of salt (sodium) in one serving. Most people should limit total sodium intake to less than 2,300 mg per day. Always check the nutrition information of foods labeled as "low-fat" or "nonfat." These foods may be higher in added sugar or refined carbs and should be avoided. Talk to your dietitian to identify your daily goals for nutrients listed on the label. Shopping Avoid buying canned, pre-made, or processed foods. These foods tend to be high in fat, sodium, and added sugar. Shop  around the outside edge of the grocery store. This is where you will most often find fresh fruits and vegetables, bulk grains, fresh meats, and fresh dairy products. Cooking Use low-heat cooking methods, such as baking, instead of high-heat cooking methods, such as deep frying. Cook using healthy oils, such as olive, canola, or sunflower oil. Avoid cooking with  butter, cream, or high-fat meats. Meal planning Eat meals and snacks regularly, preferably at the same times every day. Avoid going long periods of time without eating. Eat foods that are high in fiber, such as fresh fruits, vegetables, beans, and whole grains. Eat 4-6 oz (112-168 g) of lean protein each day, such as lean meat, chicken, fish, eggs, or tofu. One ounce (oz) (28 g) of lean protein is equal to: 1 oz (28 g) of meat, chicken, or fish. 1 egg.  cup (62 g) of tofu. Eat some foods each day that contain healthy fats, such as avocado, nuts, seeds, and fish. What foods should I eat? Fruits Berries. Apples. Oranges. Peaches. Apricots. Plums. Grapes. Mangoes. Papayas. Pomegranates. Kiwi. Cherries. Vegetables Leafy greens, including lettuce, spinach, kale, chard, collard greens, mustard greens, and cabbage. Beets. Cauliflower. Broccoli. Carrots. Green beans. Tomatoes. Peppers. Onions. Cucumbers. Brussels sprouts. Grains Whole grains, such as whole-wheat or whole-grain bread, crackers, tortillas, cereal, and pasta. Unsweetened oatmeal. Quinoa. Brown or wild rice. Meats and other proteins Seafood. Poultry without skin. Lean cuts of poultry and beef. Tofu. Nuts. Seeds. Dairy Low-fat or fat-free dairy products such as milk, yogurt, and cheese. The items listed above may not be a complete list of foods and beverages you can eat and drink. Contact a dietitian for more information. What foods should I avoid? Fruits Fruits canned with syrup. Vegetables Canned vegetables. Frozen vegetables with butter or cream sauce. Grains Refined white flour and flour products such as bread, pasta, snack foods, and cereals. Avoid all processed foods. Meats and other proteins Fatty cuts of meat. Poultry with skin. Breaded or fried meats. Processed meat. Avoid saturated fats. Dairy Full-fat yogurt, cheese, or milk. Beverages Sweetened drinks, such as soda or iced tea. The items listed above may not be a  complete list of foods and beverages you should avoid. Contact a dietitian for more information. Questions to ask a health care provider Do I need to meet with a certified diabetes care and education specialist? Do I need to meet with a dietitian? What number can I call if I have questions? When are the best times to check my blood glucose? Where to find more information: American Diabetes Association: diabetes.org Academy of Nutrition and Dietetics: eatright.[employer] of Diabetes and Digestive and Kidney Diseases: StageSync.si Association of Diabetes Care & Education Specialists: diabeteseducator.org Summary It is important to have healthy eating habits because your blood sugar (glucose) levels are greatly affected by what you eat and drink. It is important to use alcohol carefully. A healthy meal plan will help you manage your blood glucose and lower your risk of heart disease. Your health care provider may recommend that you work with a dietitian to make a meal plan that is best for you. This information is not intended to replace advice given to you by your health care provider. Make sure you discuss any questions you have with your health care provider. Document Revised: 05/23/2020 Document Reviewed: 05/23/2020 Elsevier Patient Education  2022 [employer].

## 2021-11-07 ENCOUNTER — Ambulatory Visit: Payer: Worker's Compensation | Admitting: Physician Assistant

## 2021-11-07 ENCOUNTER — Encounter: Payer: Self-pay | Admitting: Physician Assistant

## 2021-11-07 DIAGNOSIS — U071 COVID-19: Secondary | ICD-10-CM

## 2021-11-07 MED ORDER — NIRMATRELVIR/RITONAVIR (PAXLOVID)TABLET: 3.0000 | ORAL_TABLET | Freq: Two times a day (BID) | ORAL | 0 refills | Status: AC

## 2021-11-07 NOTE — Progress Notes (Signed)
° °  There were no vitals taken for this visit.   Subjective:    Patient ID: Edward Roman, male    DOB: Apr 20, 1981, 41 y.o.   MRN: [id number]  HPI: [name] is a 41 y.o. male presenting on 11/07/2021 for No chief complaint on file.   HPI    This is a telemedicine appointment.  It is via telephone as pt was unable to connect through [employer] due to his internet isn't working today and he has no data.    I connected with  Edward Roman on 11/07/21 by a video enabled telemedicine application and verified that I am speaking with the correct person using two identifiers.   I discussed the limitations of evaluation and management by telemedicine. The patient expressed understanding and agreed to proceed.  Pt is at home.  Provider is at office.     Pt is sick.  He says he has covid and just wants medicine to "clear it up".  He says his symptoms started Monday.  He was well on Sunday.   He has cough, congestion, sneezing.  When he lays down his breathing gets more difficult.   He has not checked his temperature. He has No emesis or diarrhea,  No sob,  No HA. He is taking apap.  He took a covid test yesterday that was positive.   He is not covid vaccinated.    Relevant past medical, surgical, family and social history reviewed and updated as indicated. Interim medical history since our last visit reviewed. Allergies and medications reviewed and updated.   Current Outpatient Medications:    atorvastatin (LIPITOR) 20 MG tablet, Take 1 tablet (20 mg total) by mouth daily., Disp: 90 tablet, Rfl: 1   losartan (COZAAR) 50 MG tablet, Take 1 tablet (50 mg total) by mouth daily., Disp: 30 tablet, Rfl: 1   metFORMIN (GLUCOPHAGE) 500 MG tablet, Take 1 tablet (500 mg total) by mouth 2 (two) times daily with a meal., Disp: 60 tablet, Rfl: 1    Review of Systems  Per HPI unless specifically indicated above     Objective:    There were no vitals taken for this visit.  Wt Readings  from Last 3 Encounters:  09/18/21 190 lb (86.2 kg)  09/10/21 187 lb 2.7 oz (84.9 kg)  07/24/21 173 lb (78.5 kg)    Physical Exam Pulmonary:     Effort: Pulmonary effort is normal. No respiratory distress.     Comments: Pt is talking in complete sentences without dyspnea Neurological:     Mental Status: He is alert and oriented to person, place, and time.  Psychiatric:        Attention and Perception: Attention normal.        Speech: Speech normal.          Assessment & Plan:   Encounter Diagnosis  Name Primary?   COVID-19 virus infection Yes     -rx Paxlovid to [facility] -lengthy converstaion with pt to Hold atorvastatin/DO NOT TAKE atovastatin during treatment with paxloid and for 5 days after completing his paxlovid -Work note sent to his email -pt is urged to self-quarantine until he finishes his rx to avoid spreading the virus.  He can take OTCs as needed -discussed reasons for seeking additional care/going to ER including problems breathing or chest pain -pt has routine follow up here next month.  She is to contact office sooner prn

## 2021-12-09 ENCOUNTER — Other Ambulatory Visit: Payer: Self-pay | Admitting: Physician Assistant

## 2021-12-09 DIAGNOSIS — I10 Essential (primary) hypertension: Secondary | ICD-10-CM

## 2021-12-09 DIAGNOSIS — E1169 Type 2 diabetes mellitus with other specified complication: Secondary | ICD-10-CM

## 2021-12-09 DIAGNOSIS — E1165 Type 2 diabetes mellitus with hyperglycemia: Secondary | ICD-10-CM

## 2021-12-18 ENCOUNTER — Ambulatory Visit: Payer: Worker's Compensation | Admitting: Physician Assistant

## 2022-02-24 ENCOUNTER — Emergency Department (HOSPITAL_COMMUNITY)
Admission: EM | Admit: 2022-02-24 | Discharge: 2022-02-25 | Disposition: A | Discharge status: Home / Self Care | Payer: Self-pay | Attending: Emergency Medicine | Admitting: Emergency Medicine

## 2022-02-24 ENCOUNTER — Encounter (HOSPITAL_COMMUNITY): Payer: Self-pay | Admitting: *Deleted

## 2022-02-24 ENCOUNTER — Other Ambulatory Visit: Payer: Self-pay

## 2022-02-24 DIAGNOSIS — Z7984 Long term (current) use of oral hypoglycemic drugs: Secondary | ICD-10-CM | POA: Insufficient documentation

## 2022-02-24 DIAGNOSIS — E1165 Type 2 diabetes mellitus with hyperglycemia: Secondary | ICD-10-CM | POA: Insufficient documentation

## 2022-02-24 DIAGNOSIS — R739 Hyperglycemia, unspecified: Secondary | ICD-10-CM

## 2022-02-24 LAB — CBC WITH DIFFERENTIAL/PLATELET
Abs Immature Granulocytes: 0.02 10*3/uL (ref 0.00–0.07)
Basophils Absolute: 0 10*3/uL (ref 0.0–0.1)
Basophils Relative: 1 %
Eosinophils Absolute: 0.1 10*3/uL (ref 0.0–0.5)
Eosinophils Relative: 1 %
HCT: 34.7 % — ABNORMAL LOW (ref 39.0–52.0)
Hemoglobin: 11.9 g/dL — ABNORMAL LOW (ref 13.0–17.0)
Immature Granulocytes: 0 %
Lymphocytes Relative: 31 %
Lymphs Abs: 2.4 10*3/uL (ref 0.7–4.0)
MCH: 30.7 pg (ref 26.0–34.0)
MCHC: 34.3 g/dL (ref 30.0–36.0)
MCV: 89.7 fL (ref 80.0–100.0)
Monocytes Absolute: 0.5 10*3/uL (ref 0.1–1.0)
Monocytes Relative: 7 %
Neutro Abs: 4.7 10*3/uL (ref 1.7–7.7)
Neutrophils Relative %: 60 %
Platelets: 350 10*3/uL (ref 150–400)
RBC: 3.87 MIL/uL — ABNORMAL LOW (ref 4.22–5.81)
RDW: 13.5 % (ref 11.5–15.5)
WBC: 7.8 10*3/uL (ref 4.0–10.5)
nRBC: 0 % (ref 0.0–0.2)

## 2022-02-24 LAB — BASIC METABOLIC PANEL WITH GFR
Anion gap: 7 (ref 5–15)
BUN: 11 mg/dL (ref 6–20)
CO2: 26 mmol/L (ref 22–32)
Calcium: 9.5 mg/dL (ref 8.9–10.3)
Chloride: 102 mmol/L (ref 98–111)
Creatinine, Ser: 0.8 mg/dL (ref 0.61–1.24)
GFR, Estimated: >60 mL/min
Glucose, Bld: 317 mg/dL — ABNORMAL HIGH (ref 70–99)
Potassium: 4 mmol/L (ref 3.5–5.1)
Sodium: 135 mmol/L (ref 135–145)

## 2022-02-24 LAB — CBG MONITORING, ED
Glucose-Capillary: 286 mg/dL — ABNORMAL HIGH (ref 70–99)
Glucose-Capillary: 190 mg/dL — ABNORMAL HIGH (ref 70–99)
Glucose-Capillary: 201 mg/dL — ABNORMAL HIGH (ref 70–99)

## 2022-02-24 MED ORDER — LACTATED RINGERS IV BOLUS
1000.0000 mL | Freq: Once | INTRAVENOUS | Status: AC
Start: 2022-02-24 — End: 2022-02-24
  Administered 2022-02-24: 1000 mL via INTRAVENOUS

## 2022-02-24 NOTE — ED Notes (Signed)
Pt states that he does not check his sugars at home and that he believes his blurry vision is coming from that. Pt states that he is on metformin and that he does take it regularly. Pt states he had a "cheat" day today and ate some things that he shouldn't have. ?

## 2022-02-24 NOTE — ED Provider Notes (Signed)
?[facility] ?Provider Note ? ? ?CSN: [id number] ?Arrival date & time: 02/24/22  1515 ? ?  ? ?History ? ?Chief Complaint  ?Patient presents with  ? Blurred Vision  ? ? ?Edward Roman is a 41 y.o. male. ? ?HPI ?Patient presents for concern of hyperglycemia.  He does have known DM and this is managed with metformin.  Current dose of metformin is 1000 mg twice daily.  He does report that he has recently had increased high glycemic foods in his diet.  He states that when he gets hyperglycemic, he will notice some bilateral blurry vision when focusing on objects up close.  He has experienced this recently.  His blurry vision comes and goes.  Currently he denies any visual changes.  He denies any other recent symptoms.  ?  ? ?Home Medications ?Prior to Admission medications   ?Medication Sig Start Date End Date Taking? Authorizing Provider  ?atorvastatin (LIPITOR) 20 MG tablet Take 1 tablet (20 mg total) by mouth daily. 09/18/21   Jacquelin Hawking, PA-C  ?losartan (COZAAR) 50 MG tablet Take 1 tablet (50 mg total) by mouth daily. 09/18/21   Jacquelin Hawking, PA-C  ?metFORMIN (GLUCOPHAGE) 500 MG tablet Take 1 tablet (500 mg total) by mouth 2 (two) times daily with a meal. 09/18/21   Jacquelin Hawking, PA-C  ?hydrochlorothiazide (HYDRODIURIL) 25 MG tablet Take 1 tablet (25 mg total) by mouth daily. ?Patient not taking: Reported on 11/24/2019 10/08/18 10/05/20  Ivery Quale, PA-C  ?   ? ?Allergies    ?Penicillin g   ? ?Review of Systems   ?Review of Systems  ?Eyes:  Positive for visual disturbance.  ?All other systems reviewed and are negative. ? ?Physical Exam ?Updated Vital Signs ?BP 128/78 (BP Location: Right Arm)   Pulse 76   Temp 97.8 ?F (36.6 ?C) (Oral)   Resp 18   Ht 5\' 6"  (1.676 m)   SpO2 100%   BMI 30.67 kg/m?  ?Physical Exam ?Vitals and nursing note reviewed.  ?Constitutional:   ?   General: He is not in acute distress. ?   Appearance: Normal appearance. He is well-developed and normal  weight. He is not ill-appearing, toxic-appearing or diaphoretic.  ?HENT:  ?   Head: Normocephalic and atraumatic.  ?   Right Ear: External ear normal.  ?   Left Ear: External ear normal.  ?   Nose: Nose normal.  ?   Mouth/Throat:  ?   Mouth: Mucous membranes are moist.  ?   Pharynx: Oropharynx is clear.  ?Eyes:  ?   General: No scleral icterus. ?   Extraocular Movements: Extraocular movements intact.  ?   Conjunctiva/sclera: Conjunctivae normal.  ?Cardiovascular:  ?   Rate and Rhythm: Normal rate and regular rhythm.  ?   Heart sounds: No murmur heard. ?Pulmonary:  ?   Effort: Pulmonary effort is normal. No respiratory distress.  ?Abdominal:  ?   Palpations: Abdomen is soft.  ?   Tenderness: There is no abdominal tenderness.  ?Musculoskeletal:     ?   General: No swelling. Normal range of motion.  ?   Cervical back: Normal range of motion and neck supple.  ?   Right lower leg: No edema.  ?   Left lower leg: No edema.  ?Skin: ?   General: Skin is warm and dry.  ?   Capillary Refill: Capillary refill takes less than 2 seconds.  ?   Coloration: Skin is not jaundiced or pale.  ?Neurological:  ?  General: No focal deficit present.  ?   Mental Status: He is alert and oriented to person, place, and time.  ?   Cranial Nerves: No cranial nerve deficit.  ?   Sensory: No sensory deficit.  ?   Motor: No weakness.  ?   Coordination: Coordination normal.  ?Psychiatric:     ?   Mood and Affect: Mood normal.     ?   Behavior: Behavior normal.     ?   Thought Content: Thought content normal.     ?   Judgment: Judgment normal.  ? ? ?ED Results / Procedures / Treatments   ?Labs ?(all labs ordered are listed, but only abnormal results are displayed) ?Labs Reviewed  ?CBC WITH DIFFERENTIAL/PLATELET - Abnormal; Notable for the following components:  ?    Result Value  ? RBC 3.87 (*)   ? Hemoglobin 11.9 (*)   ? HCT 34.7 (*)   ? All other components within normal limits  ?BASIC METABOLIC PANEL - Abnormal; Notable for the following  components:  ? Glucose, Bld 317 (*)   ? All other components within normal limits  ?CBG MONITORING, ED - Abnormal; Notable for the following components:  ? Glucose-Capillary 286 (*)   ? All other components within normal limits  ?CBG MONITORING, ED - Abnormal; Notable for the following components:  ? Glucose-Capillary 190 (*)   ? All other components within normal limits  ?CBG MONITORING, ED - Abnormal; Notable for the following components:  ? Glucose-Capillary 201 (*)   ? All other components within normal limits  ? ? ?EKG ?None ? ?Radiology ?No results found. ? ?Procedures ?Procedures  ? ? ?Medications Ordered in ED ?Medications  ?lactated ringers bolus 1,000 mL (0 mLs Intravenous Stopped 02/24/22 2303)  ? ? ?ED Course/ Medical Decision Making/ A&P ?  ?                        ?Medical Decision Making ?Amount and/or Complexity of Data Reviewed ?Labs: ordered. ? ? ?Patient is a well-appearing 41 year old male with history of diabetes, who presents for concerns of hyperglycemia.  2 days ago, blood sugar was found to be 439.  This was reportedly checked by EMS.  Patient does not check his blood sugars at home.  He has had recent increased intake of high glycemic foods.  Currently, he takes metformin only.  He states that when he feels hypoglycemic, he will experience some intermittent blurry vision when focusing on objects up close.  He has reported improved vision with use of dollar store reading glasses.  He also has a prescription for eyeglasses which she often does not wear.  On arrival in the ED, patient's vital signs are normal and he is well-appearing.  Prior to being bedded in the ED, lab work was obtained.  Initial sugar was added at 317.  Anion gap was normal.  Patient currently denies any vision blurriness.  Physical exam is reassuring.  Patient's sugar was rechecked and found to be in the range of 200.  He was given IV fluids and advised to follow-up with his primary care doctor to discuss medication and/or  dietary changes to better control his blood sugar.  He was discharged in good condition. ? ? ? ? ? ? ? ?Final Clinical Impression(s) / ED Diagnoses ?Final diagnoses:  ?Hyperglycemia  ? ? ?Rx / DC Orders ?ED Discharge Orders   ? ? None  ? ?  ? ? ?  ?  Gloris Manchester, MD ?02/25/22 1453 ? ?

## 2022-02-24 NOTE — Discharge Instructions (Signed)
Schedule appointment with your primary care doctor to discuss your current medications.  You will benefit in the short and long-term from better blood sugar control.  This can be achieved through diet and/or medication.  Return to the emergency department at any time if you experience any new or worsening symptoms. ?

## 2022-02-24 NOTE — ED Triage Notes (Signed)
Pt states he feels like his sugar is eye because he has some blurry vision ? ?Pt states ems came to his house x 2 days ago and when they checked it it was 439 ?

## 2022-03-03 ENCOUNTER — Telehealth: Payer: Self-pay

## 2022-03-03 NOTE — Telephone Encounter (Signed)
Provided follow up and wellness check s/p the patient's ER visit on 4.24.23.    ? ? ?Spoke with patient to advise on status of hospital visit, availability of medications, setting up a follow up appointment with [facility] and provided counseling and education regarding how hospital use versus using his PCP.  He states his transportation continues to remain intact.  Pt states he has received his blood sugar meds for 1 month use per hospital; however he is out of his cholesterol meds and other meds.   ? ? ?Plan on Today ?-Assisted pt with contacting [facility]; [facility] was unavailable at the time so he left his new contact information on there voicemail ?-Assisted pt with setting up new appointment with [facility] for (Wed) Mar 21, 2022 at 10:15am (He Has already missed 2 appt: March 30 and April 26 in which [facility] staff advised him caution while on the phone)   ?-Pt is requesting a vision screeening (I did advise him to speak with the [facility]  provider to make a referral on the next appt- May 19) ?-Pt was re-educated and counseled on how [facility]/nursing staff can assist him and therefore provided him by text message with the names and contact info. of the nursing staff (and can try one or the other if needs/questions regarding medical related issues)  ?-Also educated on importance of keeping his appts and better managing his blood sugar with the help of his PCP once he gets re-established with this new provider ?_Advised pt that we can assist him to help serve as a reminder for his Mar 21, 2022 appt o help AVOID missing the upcoming appt ? ?Pt stated he understood and call ended ?

## 2022-03-11 ENCOUNTER — Telehealth: Payer: Self-pay

## 2022-03-11 NOTE — Telephone Encounter (Signed)
Assisted pt to contact [facility] to help get meds restarted for delivery to his new address ?

## 2022-03-11 NOTE — Congregational Nurse Program (Signed)
Received email from [facility] to confirm updating the patient's new phone number and address.   They also stated that his [facility] DOES NOT EXPIRE until 9.1.2023.   However, the patient will need to call in his refills using his medicine bottles or by calling the pharmacy at [phone] and requesting a refill.  ? ?Pt was communicated by phone to call on today to contact [facility] to get refills re-started. ? ? ?

## 2022-03-12 NOTE — Congregational Nurse Program (Signed)
pt contacted clinic to make attempt in receiving assistance in contacting  [facility] to obtain his 3 meds.     ? ?plan ?-[facility] confirmed patient's contact information and delivery address ?-[facility] states they will begin re-delivery of his 3  meds within 10 days ?-Pt  was also assisted with contacting [facility] to assist pt with getting meds that he will be soon be out of prior to the arrival of [facility] order ?pt was advised he can come by [facility] office to pick up  2 gift cards to pay for meds, due to lack of fund... ? ?Pt states he will come to pick up after leaving work and understand instructions ?

## 2022-03-12 NOTE — Congregational Nurse Program (Signed)
pt requested to shop at on-site food market for family of 5 at [employer] for 26.5 lbs of and picked up 2 [employer] gift cards to purchase meds of 13.49 (atorvastatin and metformin) ?

## 2022-03-20 ENCOUNTER — Telehealth: Payer: Self-pay

## 2022-03-20 NOTE — Telephone Encounter (Signed)
Reminder to attend medical appt on 5.19.23 at 10:15am at [place] [place] not available at the time of call but did leave message and sent text reminder

## 2022-03-21 ENCOUNTER — Telehealth: Payer: Self-pay

## 2022-03-21 NOTE — Telephone Encounter (Signed)
Client returned call. He states his visit went well. His A1C was 7.4 today so provider added glipizide along with his Metformin and sent to [facility]. He states provider refilled other medications. He has no questions regarding that.  Discussed eye exam referral for client for Diabetes check. Will refer to [employer], discussed that once approved he will get an appointment at [facility] and if approved [employer] will cover the cost of an eye exam and if needed a basic pair of glasses. Client is agreeable to referral. Sent referral via secure email to Ether Griffins SW with [facility] as he reviews applications to [employer].  Food: client states he will probably come by later today to shop the [employer].  Diabetes testing supplies: client states could use another bottle of strips and lancets. Will leave at desk for client along with two log sheets.  No further needs at this time Next appt at [facility] is scheduled for 03/21/22  Francee Nodal RN [name]

## 2022-03-21 NOTE — Telephone Encounter (Signed)
Attempted follow up call after client's first appointment with [facility] today. No answer, left voicemail requesting return call.   Francee Nodal RN [name]

## 2022-04-21 NOTE — Congregational Nurse Program (Signed)
pt attended [facility]ara gunn/[facility] requesting assistance for transportation, utility and job resources.   States he is more in  need of transportation/gas resources on today.   States he is stressed and is running out of gas  States he is currently in need of  his transportation/gas to be able to get back and forth to work.   States he has been stressed and struggling financially, and attempting to make ends meet for his family.  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  Plan -pt was assisted transportation/gas per  the [employer] donation fund  - pt will be sent 2 job resources on phone by 6.20.23 to assist him with obtaining future jobs for more stability outside of the temporary job agencies.  -pt was provided by [facility] community resource guide to assist with helping him obtain resources for this upcoming utility bill that he states he most likely will not have funds to pay towards

## 2022-07-02 NOTE — Congregational Nurse Program (Signed)
Patient presented to onsite [facility] food market for return visit and received 26.14lbs of food for family of  5.

## 2022-07-30 ENCOUNTER — Emergency Department (HOSPITAL_COMMUNITY): Payer: Self-pay

## 2022-07-30 ENCOUNTER — Encounter (HOSPITAL_COMMUNITY): Payer: Self-pay | Admitting: *Deleted

## 2022-07-30 ENCOUNTER — Emergency Department (HOSPITAL_COMMUNITY)
Admission: EM | Admit: 2022-07-30 | Discharge: 2022-07-30 | Disposition: A | Discharge status: Home / Self Care | Payer: Self-pay | Attending: Emergency Medicine | Admitting: Emergency Medicine

## 2022-07-30 ENCOUNTER — Other Ambulatory Visit: Payer: Self-pay

## 2022-07-30 DIAGNOSIS — E119 Type 2 diabetes mellitus without complications: Secondary | ICD-10-CM | POA: Insufficient documentation

## 2022-07-30 DIAGNOSIS — J209 Acute bronchitis, unspecified: Secondary | ICD-10-CM | POA: Insufficient documentation

## 2022-07-30 DIAGNOSIS — Z79899 Other long term (current) drug therapy: Secondary | ICD-10-CM | POA: Insufficient documentation

## 2022-07-30 DIAGNOSIS — I10 Essential (primary) hypertension: Secondary | ICD-10-CM | POA: Insufficient documentation

## 2022-07-30 DIAGNOSIS — Z7984 Long term (current) use of oral hypoglycemic drugs: Secondary | ICD-10-CM | POA: Insufficient documentation

## 2022-07-30 MED ORDER — ALBUTEROL SULFATE HFA 108 (90 BASE) MCG/ACT IN AERS
2.0000 | INHALATION_SPRAY | Freq: Once | RESPIRATORY_TRACT | Status: AC
Start: 2022-07-30 — End: 2022-07-30
  Administered 2022-07-30: 2 via RESPIRATORY_TRACT
  Filled 2022-07-30: qty 6.7

## 2022-07-30 MED ORDER — AEROCHAMBER PLUS FLO-VU MEDIUM MISC
1.0000 | Freq: Once | Status: DC
Start: 2022-07-30 — End: 2022-07-30

## 2022-07-30 MED ORDER — AZITHROMYCIN 250 MG PO TABS
500.0000 mg | ORAL_TABLET | Freq: Once | ORAL | Status: AC
  Administered 2022-07-30: 500 mg via ORAL
  Filled 2022-07-30: qty 2

## 2022-07-30 MED ORDER — AZITHROMYCIN 250 MG PO TABS: 250.0000 mg | ORAL_TABLET | Freq: Every day | ORAL | 0 refills | Status: DC

## 2022-07-30 MED ORDER — AEROCHAMBER Z-STAT PLUS/MEDIUM MISC
1.0000 | Freq: Once | Status: AC
  Administered 2022-07-30: 1

## 2022-07-30 NOTE — Discharge Instructions (Addendum)
Take your next dose of the antibiotic tomorrow evening.  You may use the inhaler provided taking 2 puffs every 4 hours if you are wheezing, short of breath or coughing as this inhaler can help with all of the symptoms.  Rest make sure you are drinking plenty of fluids

## 2022-07-30 NOTE — ED Triage Notes (Signed)
Pt with hoarse voice and chest congestion on Sunday. Denies any fever.

## 2022-07-30 NOTE — ED Provider Notes (Signed)
[name] Provider Note   CSN: [id number] Arrival date & time: 07/30/22  1604     History  Chief Complaint  Patient presents with   Nasal Congestion    Edward Roman is a 41 y.o. male with a history significant for well-controlled diabetes and hypertension presenting for evaluation of complaints of chest congestion along with cough occasionally productive of a white thick sputum.  He also endorses wheezing and mild sore throat.  He denies fevers or chills, he denies chest pain but endorses a burning sensation through his chest which is triggered strictly by coughing.  He believes he was diagnosed with asthma as a child but has been symptom-free as an adult.  He does endorse some nasal congestion, denies headache, nausea, vomiting, abdominal pain.  Patient is a non-smoker.  He has had no treatments prior to arrival.  The history is provided by the patient.       Home Medications Prior to Admission medications   Medication Sig Start Date End Date Taking? Authorizing Provider  azithromycin (ZITHROMAX) 250 MG tablet Take 1 tablet (250 mg total) by mouth daily. Take 1 tablet daily for 4 days 07/30/22  Yes Kaelob Persky, Almyra Free, PA-C  atorvastatin (LIPITOR) 20 MG tablet Take 1 tablet (20 mg total) by mouth daily. 09/18/21   Soyla Dryer, PA-C  losartan (COZAAR) 50 MG tablet Take 1 tablet (50 mg total) by mouth daily. 09/18/21   Soyla Dryer, PA-C  metFORMIN (GLUCOPHAGE) 500 MG tablet Take 1 tablet (500 mg total) by mouth 2 (two) times daily with a meal. 09/18/21   Soyla Dryer, PA-C  hydrochlorothiazide (HYDRODIURIL) 25 MG tablet Take 1 tablet (25 mg total) by mouth daily. Patient not taking: Reported on 11/24/2019 10/08/18 10/05/20  Lily Kocher, PA-C      Allergies    Penicillin g    Review of Systems   Review of Systems  Constitutional:  Negative for chills and fever.  HENT:  Positive for congestion, rhinorrhea and sore throat. Negative for ear pain,  sinus pressure, trouble swallowing and voice change.   Eyes:  Negative for discharge.  Respiratory:  Positive for cough and wheezing. Negative for shortness of breath and stridor.   Cardiovascular:  Negative for chest pain.  Gastrointestinal:  Negative for abdominal pain, nausea and vomiting.  Genitourinary: Negative.   All other systems reviewed and are negative.   Physical Exam Updated Vital Signs BP (!) 144/95 (BP Location: Right Arm)   Pulse 86   Temp 98.5 F (36.9 C) (Oral)   Resp 16   Ht 5\' 6"  (1.676 m)   Wt 84.8 kg   SpO2 99%   BMI 30.18 kg/m  Physical Exam Vitals and nursing note reviewed.  Constitutional:      Appearance: He is well-developed.  HENT:     Head: Normocephalic and atraumatic.  Eyes:     Conjunctiva/sclera: Conjunctivae normal.  Cardiovascular:     Rate and Rhythm: Normal rate and regular rhythm.     Heart sounds: Normal heart sounds.  Pulmonary:     Effort: Pulmonary effort is normal.     Breath sounds: Wheezing present.     Comments: Expiratory wheeze with prolonged expirations.  Involves bilateral midlung fields.  No rales or rhonchi are present. Abdominal:     General: Bowel sounds are normal.     Palpations: Abdomen is soft.     Tenderness: There is no abdominal tenderness.  Musculoskeletal:        General: No  swelling. Normal range of motion.     Cervical back: Normal range of motion.  Skin:    General: Skin is warm and dry.  Neurological:     Mental Status: He is alert.     ED Results / Procedures / Treatments   Labs (all labs ordered are listed, but only abnormal results are displayed) Labs Reviewed - No data to display  EKG None  Radiology DG Chest 2 View  Result Date: 07/30/2022 CLINICAL DATA:  Cough with congestion EXAM: CHEST - 2 VIEW COMPARISON:  Chest x-ray 10/17/2009 FINDINGS: The heart size and mediastinal contours are within normal limits. Both lungs are clear. The visualized skeletal structures are unremarkable.  IMPRESSION: No active cardiopulmonary disease. Electronically Signed   By: Ronney Asters M.D.   On: 07/30/2022 17:04    Procedures Procedures    Medications Ordered in ED Medications  azithromycin (ZITHROMAX) tablet 500 mg (has no administration in time range)  aerochamber Z-Stat Plus/medium 1 each (has no administration in time range)  albuterol (VENTOLIN HFA) 108 (90 Base) MCG/ACT inhaler 2 puff (2 puffs Inhalation Given 07/30/22 1852)    ED Course/ Medical Decision Making/ A&P                           Medical Decision Making Patient presenting with cough, congestion and wheezing, burning chest pain triggered by coughing, suggesting an acute bronchitis.  He is in no respiratory distress although he does have some wheezing on his exam his oxygen saturation is 99% on room air, he is not tachypneic or tachycardic.  He was given an albuterol MDI treatment after which his wheezing completely resolved.  Chest x-ray is reassuring, clear.  Given history of diabetes he will be covered with antibiotics, started on Zithromax.  Discussed home use of his albuterol MDI, spacer was also given.  Patient denies ACS like chest pain, symptoms are burning in character and only triggered by cough but not deep inspiration suggesting acute bronchitis and as mentioned above his vital signs are stable, I doubt this represents pulmonary embolism.  Chest x-ray is negative for pneumonia.  Amount and/or Complexity of Data Reviewed Radiology: ordered and independent interpretation performed.    Details: Chest x-ray reviewed and agree with interpretation.  Risk Prescription drug management.           Final Clinical Impression(s) / ED Diagnoses Final diagnoses:  Acute bronchitis, unspecified organism    Rx / DC Orders ED Discharge Orders          Ordered    azithromycin (ZITHROMAX) 250 MG tablet  Daily        07/30/22 1919              Landis Martins 07/31/22 1831    Dorie Rank,  MD 08/01/22 [phone]

## 2022-08-11 ENCOUNTER — Telehealth: Payer: Self-pay

## 2022-08-11 NOTE — Telephone Encounter (Signed)
Returned call to client who was asking about the [employer]. He asked if he could shop today and if we had meats. Discussed with client that we have fruits and vegetables and some pantry items, however our meats are stocked on Tuesday Mornings. He reports understanding.   [employer]

## 2022-11-04 ENCOUNTER — Emergency Department (HOSPITAL_COMMUNITY)
Admission: EM | Admit: 2022-11-04 | Discharge: 2022-11-04 | Disposition: A | Discharge status: Home / Self Care | Payer: Self-pay | Attending: Emergency Medicine | Admitting: Emergency Medicine

## 2022-11-04 ENCOUNTER — Other Ambulatory Visit: Payer: Self-pay

## 2022-11-04 ENCOUNTER — Encounter (HOSPITAL_COMMUNITY): Payer: Self-pay | Admitting: Emergency Medicine

## 2022-11-04 DIAGNOSIS — R739 Hyperglycemia, unspecified: Secondary | ICD-10-CM

## 2022-11-04 DIAGNOSIS — Z7984 Long term (current) use of oral hypoglycemic drugs: Secondary | ICD-10-CM | POA: Insufficient documentation

## 2022-11-04 DIAGNOSIS — H538 Other visual disturbances: Secondary | ICD-10-CM | POA: Insufficient documentation

## 2022-11-04 DIAGNOSIS — E1165 Type 2 diabetes mellitus with hyperglycemia: Secondary | ICD-10-CM | POA: Insufficient documentation

## 2022-11-04 LAB — URINALYSIS, ROUTINE W REFLEX MICROSCOPIC
Bacteria, UA: NONE SEEN
Bilirubin Urine: NEGATIVE
Glucose, UA: >=500 mg/dL — AB
Hgb urine dipstick: NEGATIVE
Ketones, ur: NEGATIVE mg/dL
Leukocytes,Ua: NEGATIVE
Nitrite: NEGATIVE
Protein, ur: NEGATIVE mg/dL
Specific Gravity, Urine: 1.024 (ref 1.005–1.030)
pH: 5 (ref 5.0–8.0)

## 2022-11-04 LAB — CBC
HCT: 41.1 % (ref 39.0–52.0)
Hemoglobin: 14.4 g/dL (ref 13.0–17.0)
MCH: 30.2 pg (ref 26.0–34.0)
MCHC: 35 g/dL (ref 30.0–36.0)
MCV: 86.2 fL (ref 80.0–100.0)
Platelets: 286 10*3/uL (ref 150–400)
RBC: 4.77 MIL/uL (ref 4.22–5.81)
RDW: 12.3 % (ref 11.5–15.5)
WBC: 8.1 10*3/uL (ref 4.0–10.5)
nRBC: 0 % (ref 0.0–0.2)

## 2022-11-04 LAB — BASIC METABOLIC PANEL
Anion gap: 10 (ref 5–15)
BUN: 21 mg/dL — ABNORMAL HIGH (ref 6–20)
CO2: 25 mmol/L (ref 22–32)
Calcium: 9.8 mg/dL (ref 8.9–10.3)
Chloride: 96 mmol/L — ABNORMAL LOW (ref 98–111)
Creatinine, Ser: 1.03 mg/dL (ref 0.61–1.24)
GFR, Estimated: >60 mL/min (ref >60)
Glucose, Bld: 429 mg/dL — ABNORMAL HIGH (ref 70–99)
Potassium: 4.6 mmol/L (ref 3.5–5.1)
Sodium: 131 mmol/L — ABNORMAL LOW (ref 135–145)

## 2022-11-04 LAB — CBG MONITORING, ED
Glucose-Capillary: 464 mg/dL — ABNORMAL HIGH (ref 70–99)
Glucose-Capillary: 299 mg/dL — ABNORMAL HIGH (ref 70–99)

## 2022-11-04 MED ORDER — SODIUM CHLORIDE 0.9 % IV BOLUS
1000.0000 mL | Freq: Once | INTRAVENOUS | Status: AC
  Administered 2022-11-04: 1000 mL via INTRAVENOUS

## 2022-11-04 MED ORDER — INSULIN ASPART 100 UNIT/ML IJ SOLN
6.0000 [IU] | Freq: Once | INTRAMUSCULAR | Status: AC
  Administered 2022-11-04: 6 [IU] via SUBCUTANEOUS
  Filled 2022-11-04: qty 1

## 2022-11-04 NOTE — Discharge Instructions (Signed)
You are seen in the emergency department for elevated blood sugar.  Your lab work did not show any significant abnormalities other than elevated sugar.  Please stay well-hydrated and continue your medications.  Talk to your primary care doctor regarding if you need any other medication adjustment.  Return to the emergency department if any worsening or concerning symptoms

## 2022-11-04 NOTE — ED Triage Notes (Signed)
Pt reports he feels like his blood sugar is high bc he has been urinating often and feeling drowsy, pt reports he is on 1000mg  of Metformin daily, does not check CBG at home

## 2022-11-04 NOTE — ED Provider Notes (Signed)
[facility] EMERGENCY DEPARTMENT Provider Note   CSN: [id number] Arrival date & time: 11/04/22  1302     History {Add pertinent medical, surgical, social history, OB history to HPI:1} Chief Complaint  Patient presents with   Hyperglycemia    Edward Roman is a 42 y.o. male.  He has a history of diabetes and takes metformin 1000 mg daily.  He said his blood sugars are usually around 150.  He thinks with the holidays he probably was not adhering to a good diet and thinks his blood sugars are elevated.  He has had some blurry vision and feeling fatigued.  He does have a primary care doctor but has not reached out to them.  No cough shortness of breath abdominal pain vomiting diarrhea or urinary symptoms.  The history is provided by the patient.  Hyperglycemia Blood sugar level PTA:  450 Severity:  Moderate Onset quality:  Gradual Timing:  Constant Progression:  Unchanged Chronicity:  New Diabetes status:  Controlled with oral medications Context: recent change in diet   Relieved by:  None tried Ineffective treatments:  None tried Associated symptoms: blurred vision, fatigue and increased thirst   Associated symptoms: no abdominal pain, no altered mental status, no shortness of breath and no vomiting        Home Medications Prior to Admission medications   Medication Sig Start Date End Date Taking? Authorizing Provider  atorvastatin (LIPITOR) 20 MG tablet Take 1 tablet (20 mg total) by mouth daily. 09/18/21   Soyla Dryer, PA-C  azithromycin (ZITHROMAX) 250 MG tablet Take 1 tablet (250 mg total) by mouth daily. Take 1 tablet daily for 4 days 07/30/22   Evalee Jefferson, PA-C  losartan (COZAAR) 50 MG tablet Take 1 tablet (50 mg total) by mouth daily. 09/18/21   Soyla Dryer, PA-C  metFORMIN (GLUCOPHAGE) 500 MG tablet Take 1 tablet (500 mg total) by mouth 2 (two) times daily with a meal. 09/18/21   Soyla Dryer, PA-C  hydrochlorothiazide (HYDRODIURIL) 25 MG tablet Take 1  tablet (25 mg total) by mouth daily. Patient not taking: Reported on 11/24/2019 10/08/18 10/05/20  Lily Kocher, PA-C      Allergies    Penicillin g    Review of Systems   Review of Systems  Constitutional:  Positive for fatigue.  Eyes:  Positive for blurred vision and visual disturbance.  Respiratory:  Negative for shortness of breath.   Gastrointestinal:  Negative for abdominal pain and vomiting.  Endocrine: Positive for polydipsia.    Physical Exam Updated Vital Signs BP (!) 126/100 (BP Location: Right Arm)   Pulse 87   Temp 97.6 F (36.4 C) (Oral)   Resp 18   Ht 5\' 7"  (1.702 m)   Wt 79.4 kg   SpO2 96%   BMI 27.41 kg/m  Physical Exam Vitals and nursing note reviewed.  Constitutional:      General: He is not in acute distress.    Appearance: Normal appearance. He is well-developed.  HENT:     Head: Normocephalic and atraumatic.  Eyes:     Conjunctiva/sclera: Conjunctivae normal.  Cardiovascular:     Rate and Rhythm: Normal rate and regular rhythm.     Heart sounds: No murmur heard. Pulmonary:     Effort: Pulmonary effort is normal. No respiratory distress.     Breath sounds: Normal breath sounds.  Abdominal:     Palpations: Abdomen is soft.     Tenderness: There is no abdominal tenderness. There is no guarding or rebound.  Musculoskeletal:     Cervical back: Neck supple.     Right lower leg: No edema.     Left lower leg: No edema.  Skin:    General: Skin is warm and dry.     Capillary Refill: Capillary refill takes less than 2 seconds.  Neurological:     General: No focal deficit present.     Mental Status: He is alert.     ED Results / Procedures / Treatments   Labs (all labs ordered are listed, but only abnormal results are displayed) Labs Reviewed  BASIC METABOLIC PANEL - Abnormal; Notable for the following components:      Result Value   Sodium 131 (*)    Chloride 96 (*)    Glucose, Bld 429 (*)    BUN 21 (*)    All other components within  normal limits  CBG MONITORING, ED - Abnormal; Notable for the following components:   Glucose-Capillary 464 (*)    All other components within normal limits  CBC  URINALYSIS, ROUTINE W REFLEX MICROSCOPIC    EKG None  Radiology No results found.  Procedures Procedures  {Document cardiac monitor, telemetry assessment procedure when appropriate:1}  Medications Ordered in ED Medications  sodium chloride 0.9 % bolus 1,000 mL (has no administration in time range)  insulin aspart (novoLOG) injection 6 Units (has no administration in time range)    ED Course/ Medical Decision Making/ A&P                           Medical Decision Making Amount and/or Complexity of Data Reviewed Labs: ordered.  Risk Prescription drug management.   This patient complains of ***; this involves an extensive number of treatment Options and is a complaint that carries with it a high risk of complications and morbidity. The differential includes ***  I ordered, reviewed and interpreted labs, which included *** I ordered medication *** and reviewed PMP when indicated. I ordered imaging studies which included *** and I independently    visualized and interpreted imaging which showed *** Additional history obtained from *** Previous records obtained and reviewed *** I consulted *** and discussed lab and imaging findings and discussed disposition.  Cardiac monitoring reviewed, *** Social determinants considered, *** Critical Interventions: ***  After the interventions stated above, I reevaluated the patient and found *** Admission and further testing considered, ***   {Document critical care time when appropriate:1} {Document review of labs and clinical decision tools ie heart score, Chads2Vasc2 etc:1}  {Document your independent review of radiology images, and any outside records:1} {Document your discussion with family members, caretakers, and with consultants:1} {Document social determinants  of health affecting pt's care:1} {Document your decision making why or why not admission, treatments were needed:1} Final Clinical Impression(s) / ED Diagnoses Final diagnoses:  None    Rx / DC Orders ED Discharge Orders     None

## 2022-11-12 ENCOUNTER — Telehealth: Payer: Self-pay

## 2022-11-12 NOTE — Telephone Encounter (Signed)
Called client for follow up. He reports he is at work asked to be call back around 1130. Will attempt then. Client recently had ER visit next [facility] appt is scheduled for 11/20/22.   [employer]

## 2022-12-10 ENCOUNTER — Telehealth: Payer: Self-pay

## 2022-12-10 NOTE — Telephone Encounter (Signed)
Contacted client for follow up. He states that he is using the [facility] and has been working out. His family has been able to join him and are enjoying the services. He could not provide blood sugar results this morning, " I was rushed and was not able to check" states he will check tomorrow. Requested he call me back as he is currently at work.  Need to determine if client has insurance or not. Will attempt to connect with client at a later day this week.  [employer]

## 2023-01-26 ENCOUNTER — Ambulatory Visit
Admission: EM | Admit: 2023-01-26 | Discharge: 2023-01-26 | Disposition: A | Discharge status: Home / Self Care | Payer: 59 | Attending: Nurse Practitioner | Admitting: Nurse Practitioner

## 2023-01-26 DIAGNOSIS — Z8709 Personal history of other diseases of the respiratory system: Secondary | ICD-10-CM

## 2023-01-26 DIAGNOSIS — R059 Cough, unspecified: Secondary | ICD-10-CM

## 2023-01-26 MED ORDER — FLUTICASONE PROPIONATE 50 MCG/ACT NA SUSP: 2.0000 | Freq: Every day | NASAL | 0 refills | Status: DC

## 2023-01-26 MED ORDER — PSEUDOEPH-BROMPHEN-DM 30-2-10 MG/5ML PO SYRP: 5.0000 mL | ORAL_SOLUTION | Freq: Four times a day (QID) | ORAL | 0 refills | Status: DC | PRN

## 2023-01-26 MED ORDER — CETIRIZINE HCL 10 MG PO TABS: 10.0000 mg | ORAL_TABLET | Freq: Every day | ORAL | 0 refills | Status: DC

## 2023-01-26 NOTE — Discharge Instructions (Addendum)
Take medication as prescribed. Increase fluids and allow for plenty of rest. May take over-the-counter Tylenol or ibuprofen as needed for pain, fever, general discomfort. Warm salt water gargles 3-4 times daily while throat pain persist. Recommend using a humidifier in your bedroom at nighttime during sleep and sleeping elevated on pillows while cough symptoms persist. If you continue to feel well, but continue to have a nagging cough, recommend continued use of cough drops or throat lozenges, and increasing your fluid intake.  If you develop new symptoms with the cough such as fever, wheezing, shortness of breath, or difficulty breathing, please follow-up with your primary care physician for further evaluation. Follow-up as needed.

## 2023-01-26 NOTE — ED Triage Notes (Signed)
Pt reports he has a cough with green phlem, chest aches due to coughing, sneezing,  sore throat, and weak x 1 week.

## 2023-01-26 NOTE — ED Provider Notes (Addendum)
[facility]    CSN: [id number] Arrival date & time: 01/26/23  [id number]      History   Chief Complaint Chief Complaint  Patient presents with   Cough    HPI Edward Roman is a 42 y.o. male.   The history is provided by the patient.   The patient presents for complaints of coughing, sneezing, sore throat, and chest aching that has been present for the past week.  Patient states that he feels like he has "drainage or mucus" in the back of his throat that makes him cough.  Patient states throat pain worsens with his cough.  He also states that he has been more fatigued.  He denies fever, chills, headache, ear pain, wheezing, shortness of breath, difficulty breathing, or GI symptoms.  Patient reports he has been using cough drops for his symptoms.  Patient denies any obvious known sick contacts.  Patient reports that he is a diabetic.  His last A1c was 10 or 12 per his report.  Past Medical History:  Diagnosis Date   Diabetes mellitus without complication ([place])    Hypertension     Patient Active Problem List   Diagnosis Date Noted   DM (diabetes mellitus) ([place]) 07/10/2021   HTN (hypertension) 07/10/2021    History reviewed. No pertinent surgical history.     Home Medications    Prior to Admission medications   Medication Sig Start Date End Date Taking? Authorizing Provider  brompheniramine-pseudoephedrine-DM 30-2-10 MG/5ML syrup Take 5 mLs by mouth 4 (four) times daily as needed. 01/26/23  Yes Ronda Rajkumar-Warren, Alda Lea, NP  cetirizine (ZYRTEC) 10 MG tablet Take 1 tablet (10 mg total) by mouth daily. 01/26/23  Yes Walton Digilio-Warren, Alda Lea, NP  fluticasone (FLONASE) 50 MCG/ACT nasal spray Place 2 sprays into both nostrils daily. 01/26/23  Yes Aerilynn Goin-Warren, Alda Lea, NP  atorvastatin (LIPITOR) 20 MG tablet Take 1 tablet (20 mg total) by mouth daily. 09/18/21   Soyla Dryer, PA-C  azithromycin (ZITHROMAX) 250 MG tablet Take 1 tablet (250 mg total) by mouth daily.  Take 1 tablet daily for 4 days 07/30/22   Evalee Jefferson, PA-C  losartan (COZAAR) 50 MG tablet Take 1 tablet (50 mg total) by mouth daily. 09/18/21   Soyla Dryer, PA-C  metFORMIN (GLUCOPHAGE) 500 MG tablet Take 1 tablet (500 mg total) by mouth 2 (two) times daily with a meal. 09/18/21   Soyla Dryer, PA-C  hydrochlorothiazide (HYDRODIURIL) 25 MG tablet Take 1 tablet (25 mg total) by mouth daily. Patient not taking: Reported on 11/24/2019 10/08/18 10/05/20  Lily Kocher, PA-C    Family History Family History  Problem Relation Age of Onset   Hypertension Mother    Diabetes Mother    Cancer Father    Hypertension Father    Diabetes Father    Hypertension Other    Diabetes Other    Thyroid disease Other     Social History Social History   Tobacco Use   Smoking status: Never   Smokeless tobacco: Never  Vaping Use   Vaping Use: Never used  Substance Use Topics   Alcohol use: Not Currently   Drug use: No     Allergies   Penicillin g   Review of Systems Review of Systems Per HPI  Physical Exam Triage Vital Signs ED Triage Vitals [01/26/23 1030]  Enc Vitals Group     BP 136/87     Pulse Rate 83     Resp 20     Temp (!)  97.4 F (36.3 C)     Temp Source Oral     SpO2 95 %     Weight      Height      Head Circumference      Peak Flow      Pain Score 4     Pain Loc      Pain Edu?      Excl. in [place]?    No data found.  Updated Vital Signs BP 136/87 (BP Location: Right Arm)   Pulse 83   Temp (!) 97.4 F (36.3 C) (Oral)   Resp 20   SpO2 95%   Visual Acuity Right Eye Distance:   Left Eye Distance:   Bilateral Distance:    Right Eye Near:   Left Eye Near:    Bilateral Near:     Physical Exam Vitals and nursing note reviewed.  Constitutional:      General: He is not in acute distress.    Appearance: Normal appearance.  HENT:     Head: Normocephalic.     Right Ear: Tympanic membrane, ear canal and external ear normal.     Left Ear: Tympanic  membrane, ear canal and external ear normal.     Nose: Nose normal.     Mouth/Throat:     Mouth: Mucous membranes are moist.     Pharynx: Posterior oropharyngeal erythema present.  Eyes:     Extraocular Movements: Extraocular movements intact.     Conjunctiva/sclera: Conjunctivae normal.     Pupils: Pupils are equal, round, and reactive to light.  Cardiovascular:     Rate and Rhythm: Normal rate and regular rhythm.     Pulses: Normal pulses.     Heart sounds: Normal heart sounds.  Pulmonary:     Effort: Pulmonary effort is normal.     Breath sounds: Normal breath sounds.  Abdominal:     General: Bowel sounds are normal.     Palpations: Abdomen is soft.     Tenderness: There is no abdominal tenderness.  Musculoskeletal:     Cervical back: Normal range of motion.  Lymphadenopathy:     Cervical: No cervical adenopathy.  Skin:    General: Skin is warm and dry.  Neurological:     General: No focal deficit present.     Mental Status: He is alert and oriented to person, place, and time.  Psychiatric:        Mood and Affect: Mood normal.        Behavior: Behavior normal.      UC Treatments / Results  Labs (all labs ordered are listed, but only abnormal results are displayed) Labs Reviewed - No data to display  EKG   Radiology No results found.  Procedures Procedures (including critical care time)  Medications Ordered in UC Medications - No data to display  Initial Impression / Assessment and Plan / UC Course  I have reviewed the triage vital signs and the nursing notes.  Pertinent labs & imaging results that were available during my care of the patient were reviewed by me and considered in my medical decision making (see chart for details).  The patient is well-appearing, he is in no acute distress, vital signs are stable.  Patient with a cough that is been present for the past week.  He also has sneezing, and sore throat.  On exam, patient's lung sounds are clear  throughout, he does have cobblestoning present in the posterior oropharynx.  Do not suspect pneumonia  or bronchitis at this time patient is afebrile, lung sounds are clear throughout, and he is not tachycardic. Symptoms are consistent with allergic rhinitis.  Will treat patient with Bromfed-DM for the cough, fluticasone 50 micro nasal spray, and cetirizine 10 mg.  Supportive care recommendations were provided and discussed with the patient to include increasing fluids, allowing for plenty of rest, warm salt water gargles, and over-the-counter analgesics for pain or discomfort.  Patient was encouraged to make sure he is taking his diabetes medications daily.  Recommended follow-up with his PCP as needed for reevaluation for his diabetes.  Patient is in agreement with this plan of care and verbalizes understanding.  All questions were answered.  Patient stable for discharge.  Final Clinical Impressions(s) / UC Diagnoses   Final diagnoses:  History of allergic rhinitis  Cough, unspecified type     Discharge Instructions      Take medication as prescribed. Increase fluids and allow for plenty of rest. May take over-the-counter Tylenol or ibuprofen as needed for pain, fever, general discomfort. Warm salt water gargles 3-4 times daily while throat pain persist. Recommend using a humidifier in your bedroom at nighttime during sleep and sleeping elevated on pillows while cough symptoms persist. If you continue to feel well, but continue to have a nagging cough, recommend continued use of cough drops or throat lozenges, and increasing your fluid intake.  If you develop new symptoms with the cough such as fever, wheezing, shortness of breath, or difficulty breathing, please follow-up with your primary care physician for further evaluation. Follow-up as needed.     ED Prescriptions     Medication Sig Dispense Auth. Provider   brompheniramine-pseudoephedrine-DM 30-2-10 MG/5ML syrup Take 5 mLs by mouth  4 (four) times daily as needed. 140 mL Candace Ramus-Warren, Alda Lea, NP   cetirizine (ZYRTEC) 10 MG tablet Take 1 tablet (10 mg total) by mouth daily. 30 tablet Oneal Biglow-Warren, Alda Lea, NP   fluticasone (FLONASE) 50 MCG/ACT nasal spray Place 2 sprays into both nostrils daily. 16 g Romulus Hanrahan-Warren, Alda Lea, NP      PDMP not reviewed this encounter.   Tish Men, NP 01/26/23 1048    Mackensey Bolte-Warren, Alda Lea, NP 01/26/23 1102

## 2023-02-10 ENCOUNTER — Telehealth: Payer: Self-pay

## 2023-02-10 NOTE — Telephone Encounter (Signed)
[facility] client sent text message asking about food market, he will ask his significant other to come this afternoon. He also asked when his next appointment with [facility] is and I contacted Blondell Reveal at [facility] and he is scheduled for 02/25/23 at 2:30pm.  Client texted that information and he confirmed receipt.   Francee Nodal RN [name]

## 2023-02-24 ENCOUNTER — Telehealth: Payer: Self-pay

## 2023-02-24 NOTE — Telephone Encounter (Signed)
Client called about food market, states he will ask girlfriend Cassandra to come shop today. Reminded client of his appointment with [facility] for 02/25/23 at 2:30 pm. Reminded client to please come talk to [facility] about having possible [employer]. He states he will as soon as he can.   Francee Nodal RN [name]

## 2023-02-25 DIAGNOSIS — Z6827 Body mass index (BMI) 27.0-27.9, adult: Secondary | ICD-10-CM | POA: Diagnosis not present

## 2023-02-25 DIAGNOSIS — Z713 Dietary counseling and surveillance: Secondary | ICD-10-CM | POA: Diagnosis not present

## 2023-02-25 DIAGNOSIS — I10 Essential (primary) hypertension: Secondary | ICD-10-CM | POA: Diagnosis not present

## 2023-02-25 DIAGNOSIS — E785 Hyperlipidemia, unspecified: Secondary | ICD-10-CM | POA: Diagnosis not present

## 2023-02-25 DIAGNOSIS — E119 Type 2 diabetes mellitus without complications: Secondary | ICD-10-CM | POA: Diagnosis not present

## 2023-03-18 ENCOUNTER — Telehealth: Payer: Self-pay

## 2023-03-18 NOTE — Telephone Encounter (Signed)
Received text message from Mr. Galiza to contact him ASAP, attempted to call this am and no answer, and unable to leave a voicemail as no voicemail is set up. Also sent text message back to contact me. Will await, response.   Francee Nodal RN [name]

## 2023-03-23 ENCOUNTER — Telehealth: Payer: Self-pay

## 2023-03-23 NOTE — Telephone Encounter (Signed)
Contacted by Edward Roman asking if we have any food in the pantry today. Discussed via text message what is available. He may come today or tomorrow.   Francee Nodal RN [name]

## 2023-05-18 ENCOUNTER — Telehealth: Payer: Self-pay

## 2023-05-18 NOTE — Telephone Encounter (Signed)
Mr Kalafut had called twice this am and I was out of office. Attempted call back when in the office, and ended up text message. He states he is having "burning in his knees when he walks" states no swelling or injury. Recommended he follow up with his primary care provider. He has an upcoming appointment on 05/27/23 at 3PM.    He states understanding. Was a client of [facility], however now has had insurance. His PCP is [facility]. He continues to utilize [facility].   Francee Nodal RN                     [name]

## 2023-06-02 ENCOUNTER — Telehealth: Payer: Self-pay

## 2023-06-02 DIAGNOSIS — E785 Hyperlipidemia, unspecified: Secondary | ICD-10-CM | POA: Diagnosis not present

## 2023-06-02 DIAGNOSIS — E119 Type 2 diabetes mellitus without complications: Secondary | ICD-10-CM | POA: Diagnosis not present

## 2023-06-02 DIAGNOSIS — Z79899 Other long term (current) drug therapy: Secondary | ICD-10-CM | POA: Diagnosis not present

## 2023-06-02 DIAGNOSIS — J029 Acute pharyngitis, unspecified: Secondary | ICD-10-CM | POA: Diagnosis not present

## 2023-06-02 DIAGNOSIS — I10 Essential (primary) hypertension: Secondary | ICD-10-CM | POA: Diagnosis not present

## 2023-06-02 DIAGNOSIS — Z713 Dietary counseling and surveillance: Secondary | ICD-10-CM | POA: Diagnosis not present

## 2023-06-02 NOTE — Telephone Encounter (Signed)
Mr Snouffer texted requesting the number to his Primary care [facility], text returned with PCP's contact number.   Francee Nodal RN [name]

## 2023-06-04 ENCOUNTER — Ambulatory Visit: Payer: Self-pay | Admitting: Nurse Practitioner

## 2023-06-04 DIAGNOSIS — Z7984 Long term (current) use of oral hypoglycemic drugs: Secondary | ICD-10-CM

## 2023-06-04 DIAGNOSIS — E1165 Type 2 diabetes mellitus with hyperglycemia: Secondary | ICD-10-CM

## 2023-06-04 NOTE — Progress Notes (Deleted)
Erroneous encounter

## 2023-06-08 ENCOUNTER — Telehealth: Payer: Self-pay

## 2023-06-08 NOTE — Telephone Encounter (Signed)
Edward Roman contacted [name] in regards to food in food market today. Texted him back that we have plenty of food today and he will shop after work today.   Francee Nodal RN [name]

## 2023-06-30 ENCOUNTER — Encounter (HOSPITAL_COMMUNITY): Payer: Self-pay | Admitting: Emergency Medicine

## 2023-06-30 ENCOUNTER — Other Ambulatory Visit: Payer: Self-pay

## 2023-06-30 ENCOUNTER — Emergency Department (HOSPITAL_COMMUNITY)
Admission: EM | Admit: 2023-06-30 | Discharge: 2023-06-30 | Disposition: A | Discharge status: Home / Self Care | Payer: 59 | Attending: Emergency Medicine | Admitting: Emergency Medicine

## 2023-06-30 DIAGNOSIS — R739 Hyperglycemia, unspecified: Secondary | ICD-10-CM

## 2023-06-30 DIAGNOSIS — Z7984 Long term (current) use of oral hypoglycemic drugs: Secondary | ICD-10-CM | POA: Insufficient documentation

## 2023-06-30 DIAGNOSIS — R5383 Other fatigue: Secondary | ICD-10-CM | POA: Diagnosis not present

## 2023-06-30 DIAGNOSIS — E1165 Type 2 diabetes mellitus with hyperglycemia: Secondary | ICD-10-CM | POA: Insufficient documentation

## 2023-06-30 LAB — CBC
HCT: 41.3 % (ref 39.0–52.0)
Hemoglobin: 14.6 g/dL (ref 13.0–17.0)
MCH: 30.7 pg (ref 26.0–34.0)
MCHC: 35.4 g/dL (ref 30.0–36.0)
MCV: 86.9 fL (ref 80.0–100.0)
Platelets: 328 10*3/uL (ref 150–400)
RBC: 4.75 MIL/uL (ref 4.22–5.81)
RDW: 12.2 % (ref 11.5–15.5)
WBC: 7.7 10*3/uL (ref 4.0–10.5)
nRBC: 0 % (ref 0.0–0.2)

## 2023-06-30 LAB — URINALYSIS, ROUTINE W REFLEX MICROSCOPIC
Bacteria, UA: NONE SEEN
Bilirubin Urine: NEGATIVE
Glucose, UA: >=500 mg/dL — AB
Hgb urine dipstick: NEGATIVE
Ketones, ur: NEGATIVE mg/dL
Leukocytes,Ua: NEGATIVE
Nitrite: NEGATIVE
Protein, ur: NEGATIVE mg/dL
Specific Gravity, Urine: 1.022 (ref 1.005–1.030)
pH: 6 (ref 5.0–8.0)

## 2023-06-30 LAB — COMPREHENSIVE METABOLIC PANEL
ALT: 22 U/L (ref 0–44)
AST: 15 U/L (ref 15–41)
Albumin: 4.9 g/dL (ref 3.5–5.0)
Alkaline Phosphatase: 79 U/L (ref 38–126)
Anion gap: 10 (ref 5–15)
BUN: 16 mg/dL (ref 6–20)
CO2: 27 mmol/L (ref 22–32)
Calcium: 9.9 mg/dL (ref 8.9–10.3)
Chloride: 97 mmol/L — ABNORMAL LOW (ref 98–111)
Creatinine, Ser: 1.04 mg/dL (ref 0.61–1.24)
GFR, Estimated: >60 mL/min (ref >60)
Glucose, Bld: 359 mg/dL — ABNORMAL HIGH (ref 70–99)
Potassium: 4.3 mmol/L (ref 3.5–5.1)
Sodium: 134 mmol/L — ABNORMAL LOW (ref 135–145)
Total Bilirubin: 1.2 mg/dL (ref 0.3–1.2)
Total Protein: 8.8 g/dL — ABNORMAL HIGH (ref 6.5–8.1)

## 2023-06-30 LAB — CBG MONITORING, ED
Glucose-Capillary: 367 mg/dL — ABNORMAL HIGH (ref 70–99)
Glucose-Capillary: 263 mg/dL — ABNORMAL HIGH (ref 70–99)
Glucose-Capillary: 180 mg/dL — ABNORMAL HIGH (ref 70–99)

## 2023-06-30 MED ORDER — SODIUM CHLORIDE 0.9 % IV BOLUS
1000.0000 mL | Freq: Once | INTRAVENOUS | Status: AC
  Administered 2023-06-30: 1000 mL via INTRAVENOUS

## 2023-06-30 MED ORDER — INSULIN ASPART 100 UNIT/ML IJ SOLN
5.0000 [IU] | Freq: Once | INTRAMUSCULAR | Status: AC
  Administered 2023-06-30: 5 [IU] via INTRAVENOUS
  Filled 2023-06-30: qty 1

## 2023-06-30 NOTE — ED Triage Notes (Signed)
Pt states he was feeling tired today at work and blood sugar was 348. Pt is supposed to take metformin BID but states it hurts his stomach so has only been taking it once every couple of days. Pt took it this morning at 830. Pt states he has had frequent urination x 2 days.

## 2023-06-30 NOTE — ED Provider Notes (Signed)
[place] EMERGENCY DEPARTMENT AT [facility] Provider Note   CSN: [id number] Arrival date & time: 06/30/23  1255     History  Chief Complaint  Patient presents with   Hyperglycemia    Edward Roman is a 42 y.o. male.  Patient presents with concern for fatigue, listlessness. Patient has a history of insulin-dependent diabetes, states that he takes his medication occasionally, possibly as frequently as every other day.  Today he presents with concern for hyperglycemia and after mentioned listlessness. No focal pain, no vomiting, no diarrhea.       Home Medications Prior to Admission medications   Medication Sig Start Date End Date Taking? Authorizing Provider  atorvastatin (LIPITOR) 20 MG tablet Take 1 tablet (20 mg total) by mouth daily. 09/18/21   Jacquelin Hawking, PA-C  azithromycin (ZITHROMAX) 250 MG tablet Take 1 tablet (250 mg total) by mouth daily. Take 1 tablet daily for 4 days 07/30/22   Burgess Amor, PA-C  brompheniramine-pseudoephedrine-DM 30-2-10 MG/5ML syrup Take 5 mLs by mouth 4 (four) times daily as needed. 01/26/23   Leath-Warren, Sadie Haber, NP  cetirizine (ZYRTEC) 10 MG tablet Take 1 tablet (10 mg total) by mouth daily. 01/26/23   Leath-Warren, Sadie Haber, NP  fluticasone (FLONASE) 50 MCG/ACT nasal spray Place 2 sprays into both nostrils daily. 01/26/23   Leath-Warren, Sadie Haber, NP  losartan (COZAAR) 50 MG tablet Take 1 tablet (50 mg total) by mouth daily. 09/18/21   Jacquelin Hawking, PA-C  metFORMIN (GLUCOPHAGE) 500 MG tablet Take 1 tablet (500 mg total) by mouth 2 (two) times daily with a meal. 09/18/21   Jacquelin Hawking, PA-C  hydrochlorothiazide (HYDRODIURIL) 25 MG tablet Take 1 tablet (25 mg total) by mouth daily. Patient not taking: Reported on 11/24/2019 10/08/18 10/05/20  Ivery Quale, PA-C      Allergies    Penicillin g    Review of Systems   Review of Systems  All other systems reviewed and are negative.   Physical Exam Updated  Vital Signs BP (!) 126/93   Pulse 85   Temp 98 F (36.7 C)   Resp 18   Ht 5\' 6"  (1.676 m)   Wt 80.7 kg   SpO2 96%   BMI 28.73 kg/m  Physical Exam Vitals and nursing note reviewed.  Constitutional:      General: He is not in acute distress.    Appearance: He is well-developed.  HENT:     Head: Normocephalic and atraumatic.  Eyes:     Conjunctiva/sclera: Conjunctivae normal.  Cardiovascular:     Rate and Rhythm: Normal rate and regular rhythm.  Pulmonary:     Effort: Pulmonary effort is normal. No respiratory distress.     Breath sounds: No stridor.  Abdominal:     General: There is no distension.  Skin:    General: Skin is warm and dry.  Neurological:     Mental Status: He is alert and oriented to person, place, and time.     ED Results / Procedures / Treatments   Labs (all labs ordered are listed, but only abnormal results are displayed) Labs Reviewed  URINALYSIS, ROUTINE W REFLEX MICROSCOPIC - Abnormal; Notable for the following components:      Result Value   Color, Urine STRAW (*)    Glucose, UA >=500 (*)    All other components within normal limits  COMPREHENSIVE METABOLIC PANEL - Abnormal; Notable for the following components:   Sodium 134 (*)    Chloride 97 (*)  Glucose, Bld 359 (*)    Total Protein 8.8 (*)    All other components within normal limits  CBG MONITORING, ED - Abnormal; Notable for the following components:   Glucose-Capillary 367 (*)    All other components within normal limits  CBG MONITORING, ED - Abnormal; Notable for the following components:   Glucose-Capillary 263 (*)    All other components within normal limits  CBC    EKG None  Radiology No results found.  Procedures Procedures    Medications Ordered in ED Medications  sodium chloride 0.9 % bolus 1,000 mL (1,000 mLs Intravenous New Bag/Given 06/30/23 1807)  insulin aspart (novoLOG) injection 5 Units (5 Units Intravenous Given 06/30/23 1821)    ED Course/ Medical  Decision Making/ A&P                                 Medical Decision Making Adult male with diabetes, acknowledged medication inconsistent compliance now presents with fatigue, listlessness, hyperglycemia.  Differential includes hyperglycemia, dehydration, nonketotic hyperosmolar state, DKA.  No early evidence for other phenomena, electrolyte abnormalities.  Patient's initial glucose greater than 315, patient received fluids, 5 units of insulin, had no evidence for DKA, improved here, was encouraged to obtain intake he has recently prescribed Januvia which he had not obtained, and to follow-up with his primary care physician.  Amount and/or Complexity of Data Reviewed External Data Reviewed: notes. Labs: ordered. Decision-making details documented in ED Course.  Risk Prescription drug management. Decision regarding hospitalization. Diagnosis or treatment significantly limited by social determinants of health.  Final Clinical Impression(s) / ED Diagnoses Final diagnoses:  Hyperglycemia    Rx / DC Orders ED Discharge Orders     None         Gerhard Munch, MD 06/30/23 2015

## 2023-06-30 NOTE — Discharge Instructions (Signed)
As discussed, today's evaluation has been generally reassuring.  Your glucose level has reduced appropriately with fluids and insulin.  However, it is important you obtain your recently prescribed medication, and use it as directed while being sure to follow-up with your physician.  Return here for concerning changes in your condition.

## 2023-07-07 ENCOUNTER — Ambulatory Visit
Admission: EM | Admit: 2023-07-07 | Discharge: 2023-07-07 | Disposition: A | Discharge status: Home / Self Care | Payer: 59 | Attending: Family Medicine | Admitting: Family Medicine

## 2023-07-07 DIAGNOSIS — Z1152 Encounter for screening for COVID-19: Secondary | ICD-10-CM | POA: Insufficient documentation

## 2023-07-07 DIAGNOSIS — J069 Acute upper respiratory infection, unspecified: Secondary | ICD-10-CM | POA: Diagnosis not present

## 2023-07-07 DIAGNOSIS — R059 Cough, unspecified: Secondary | ICD-10-CM | POA: Diagnosis not present

## 2023-07-07 DIAGNOSIS — B9789 Other viral agents as the cause of diseases classified elsewhere: Secondary | ICD-10-CM | POA: Diagnosis not present

## 2023-07-07 MED ORDER — PROMETHAZINE-DM 6.25-15 MG/5ML PO SYRP: 5.0000 mL | ORAL_SOLUTION | Freq: Four times a day (QID) | ORAL | 0 refills | Status: DC | PRN

## 2023-07-07 NOTE — ED Triage Notes (Signed)
Pt c/o cough and congestion stinging with cough chest tightness heaviness, body ache.

## 2023-07-07 NOTE — ED Provider Notes (Signed)
[facility]    CSN: [id number] Arrival date & time: 07/07/23  1541      History   Chief Complaint No chief complaint on file.   HPI Edward Roman is a 42 y.o. male.   Patient presenting today with several day history of cough, congestion, fatigue, body aches, chest tightness.  Denies chest pain, abdominal pain, nausea vomiting or diarrhea.  So far taking over-the-counter cold and congestion medications with minimal relief.  Numerous sick contacts with similar symptoms in the household.    Past Medical History:  Diagnosis Date   Diabetes mellitus without complication (HCC)    Hypertension     Patient Active Problem List   Diagnosis Date Noted   DM (diabetes mellitus) (HCC) 07/10/2021   HTN (hypertension) 07/10/2021    History reviewed. No pertinent surgical history.     Home Medications    Prior to Admission medications   Medication Sig Start Date End Date Taking? Authorizing Provider  promethazine-dextromethorphan (PROMETHAZINE-DM) 6.25-15 MG/5ML syrup Take 5 mLs by mouth 4 (four) times daily as needed. 07/07/23  Yes Particia Nearing, PA-C  atorvastatin (LIPITOR) 20 MG tablet Take 1 tablet (20 mg total) by mouth daily. 09/18/21   Jacquelin Hawking, PA-C  azithromycin (ZITHROMAX) 250 MG tablet Take 1 tablet (250 mg total) by mouth daily. Take 1 tablet daily for 4 days 07/30/22   Burgess Amor, PA-C  brompheniramine-pseudoephedrine-DM 30-2-10 MG/5ML syrup Take 5 mLs by mouth 4 (four) times daily as needed. 01/26/23   Leath-Warren, Sadie Haber, NP  cetirizine (ZYRTEC) 10 MG tablet Take 1 tablet (10 mg total) by mouth daily. 01/26/23   Leath-Warren, Sadie Haber, NP  fluticasone (FLONASE) 50 MCG/ACT nasal spray Place 2 sprays into both nostrils daily. 01/26/23   Leath-Warren, Sadie Haber, NP  losartan (COZAAR) 50 MG tablet Take 1 tablet (50 mg total) by mouth daily. 09/18/21   Jacquelin Hawking, PA-C  metFORMIN (GLUCOPHAGE) 500 MG tablet Take 1 tablet (500 mg total)  by mouth 2 (two) times daily with a meal. 09/18/21   Jacquelin Hawking, PA-C  hydrochlorothiazide (HYDRODIURIL) 25 MG tablet Take 1 tablet (25 mg total) by mouth daily. Patient not taking: Reported on 11/24/2019 10/08/18 10/05/20  Ivery Quale, PA-C    Family History Family History  Problem Relation Age of Onset   Hypertension Mother    Diabetes Mother    Cancer Father    Hypertension Father    Diabetes Father    Hypertension Other    Diabetes Other    Thyroid disease Other     Social History Social History   Tobacco Use   Smoking status: Never   Smokeless tobacco: Never  Vaping Use   Vaping status: Never Used  Substance Use Topics   Alcohol use: Not Currently   Drug use: No     Allergies   Penicillin g   Review of Systems Review of Systems PER HPI  Physical Exam Triage Vital Signs ED Triage Vitals  Encounter Vitals Group     BP 07/07/23 1657 (!) 134/95     Systolic BP Percentile --      Diastolic BP Percentile --      Pulse Rate 07/07/23 1657 90     Resp 07/07/23 1657 13     Temp 07/07/23 1657 99.2 F (37.3 C)     Temp Source 07/07/23 1657 Oral     SpO2 07/07/23 1657 96 %     Weight --      Height --  Head Circumference --      Peak Flow --      Pain Score 07/07/23 1700 6     Pain Loc --      Pain Education --      Exclude from Growth Chart --    No data found.  Updated Vital Signs BP (!) 134/95 (BP Location: Right Arm)   Pulse 90   Temp 99.2 F (37.3 C) (Oral)   Resp 13   SpO2 96%   Visual Acuity Right Eye Distance:   Left Eye Distance:   Bilateral Distance:    Right Eye Near:   Left Eye Near:    Bilateral Near:     Physical Exam Vitals and nursing note reviewed.  Constitutional:      Appearance: He is well-developed.  HENT:     Head: Atraumatic.     Right Ear: External ear normal.     Left Ear: External ear normal.     Nose: Rhinorrhea present.     Mouth/Throat:     Pharynx: Posterior oropharyngeal erythema present. No  oropharyngeal exudate.  Eyes:     Conjunctiva/sclera: Conjunctivae normal.     Pupils: Pupils are equal, round, and reactive to light.  Cardiovascular:     Rate and Rhythm: Normal rate and regular rhythm.  Pulmonary:     Effort: Pulmonary effort is normal. No respiratory distress.     Breath sounds: No wheezing or rales.  Musculoskeletal:        General: Normal range of motion.     Cervical back: Normal range of motion and neck supple.  Lymphadenopathy:     Cervical: No cervical adenopathy.  Skin:    General: Skin is warm and dry.  Neurological:     Mental Status: He is alert and oriented to person, place, and time.  Psychiatric:        Behavior: Behavior normal.      UC Treatments / Results  Labs (all labs ordered are listed, but only abnormal results are displayed) Labs Reviewed  SARS CORONAVIRUS 2 (TAT 6-24 HRS)    EKG   Radiology No results found.  Procedures Procedures (including critical care time)  Medications Ordered in UC Medications - No data to display  Initial Impression / Assessment and Plan / UC Course  I have reviewed the triage vital signs and the nursing notes.  Pertinent labs & imaging results that were available during my care of the patient were reviewed by me and considered in my medical decision making (see chart for details).     Vital signs and exam reassuring, suspicious for COVID-19.  COVID testing pending, good candidate for molnupiravir if positive.  Discussed for over-the-counter medications, home care and Phenergan DM for symptomatic relief.  Work note given.  Return for worsening symptoms.  Final Clinical Impressions(s) / UC Diagnoses   Final diagnoses:  Viral URI with cough   Discharge Instructions   None    ED Prescriptions     Medication Sig Dispense Auth. Provider   promethazine-dextromethorphan (PROMETHAZINE-DM) 6.25-15 MG/5ML syrup Take 5 mLs by mouth 4 (four) times daily as needed. 100 mL Particia Nearing,  [place]      PDMP not reviewed this encounter.   Particia Nearing, [place] 07/07/23 1721

## 2023-07-08 LAB — SARS CORONAVIRUS 2 (TAT 6-24 HRS): SARS Coronavirus 2: NEGATIVE

## 2023-07-31 ENCOUNTER — Other Ambulatory Visit: Payer: Self-pay

## 2023-07-31 ENCOUNTER — Encounter (HOSPITAL_COMMUNITY): Payer: Self-pay | Admitting: *Deleted

## 2023-07-31 ENCOUNTER — Emergency Department (HOSPITAL_COMMUNITY)
Admission: EM | Admit: 2023-07-31 | Discharge: 2023-07-31 | Disposition: A | Discharge status: Home / Self Care | Payer: 59 | Attending: Emergency Medicine | Admitting: Emergency Medicine

## 2023-07-31 DIAGNOSIS — Z7984 Long term (current) use of oral hypoglycemic drugs: Secondary | ICD-10-CM | POA: Diagnosis not present

## 2023-07-31 DIAGNOSIS — I10 Essential (primary) hypertension: Secondary | ICD-10-CM | POA: Diagnosis not present

## 2023-07-31 DIAGNOSIS — E1165 Type 2 diabetes mellitus with hyperglycemia: Secondary | ICD-10-CM | POA: Insufficient documentation

## 2023-07-31 DIAGNOSIS — Z79899 Other long term (current) drug therapy: Secondary | ICD-10-CM | POA: Diagnosis not present

## 2023-07-31 DIAGNOSIS — R739 Hyperglycemia, unspecified: Secondary | ICD-10-CM | POA: Diagnosis not present

## 2023-07-31 LAB — CBC
HCT: 42.1 % (ref 39.0–52.0)
Hemoglobin: 14.4 g/dL (ref 13.0–17.0)
MCH: 29.6 pg (ref 26.0–34.0)
MCHC: 34.2 g/dL (ref 30.0–36.0)
MCV: 86.4 fL (ref 80.0–100.0)
Platelets: 312 10*3/uL (ref 150–400)
RBC: 4.87 MIL/uL (ref 4.22–5.81)
RDW: 11.8 % (ref 11.5–15.5)
WBC: 10.1 10*3/uL (ref 4.0–10.5)
nRBC: 0 % (ref 0.0–0.2)

## 2023-07-31 LAB — BASIC METABOLIC PANEL
Anion gap: 11 (ref 5–15)
BUN: 14 mg/dL (ref 6–20)
CO2: 27 mmol/L (ref 22–32)
Calcium: 10.1 mg/dL (ref 8.9–10.3)
Chloride: 98 mmol/L (ref 98–111)
Creatinine, Ser: 1.06 mg/dL (ref 0.61–1.24)
GFR, Estimated: >60 mL/min (ref >60)
Glucose, Bld: 340 mg/dL — ABNORMAL HIGH (ref 70–99)
Potassium: 5 mmol/L (ref 3.5–5.1)
Sodium: 136 mmol/L (ref 135–145)

## 2023-07-31 LAB — URINALYSIS, ROUTINE W REFLEX MICROSCOPIC
Bacteria, UA: NONE SEEN
Bilirubin Urine: NEGATIVE
Glucose, UA: >=500 mg/dL — AB
Hgb urine dipstick: NEGATIVE
Ketones, ur: NEGATIVE mg/dL
Leukocytes,Ua: NEGATIVE
Nitrite: NEGATIVE
Protein, ur: NEGATIVE mg/dL
Specific Gravity, Urine: 1.024 (ref 1.005–1.030)
pH: 5 (ref 5.0–8.0)

## 2023-07-31 LAB — CBG MONITORING, ED: Glucose-Capillary: 336 mg/dL — ABNORMAL HIGH (ref 70–99)

## 2023-07-31 MED ORDER — METFORMIN HCL 500 MG PO TABS: 500.0000 mg | ORAL_TABLET | Freq: Two times a day (BID) | ORAL | 2 refills | Status: DC

## 2023-07-31 NOTE — ED Triage Notes (Signed)
Pt with elevated blood sugar, 394 at home most recent.  Recently started medication after being off medication for a week.  Pt noted increased in urination.

## 2023-07-31 NOTE — ED Notes (Signed)
See triage notes. Nad. Pt states forgot to take meds today

## 2023-07-31 NOTE — Discharge Instructions (Addendum)
As discussed, please keep well-hydrated at home with water and electrolyte drinks such as Pedialyte.  I have sent in a prescription for metformin.  Please continue on this medication as prescribed.  Please take this medication with food as this can sometimes help decrease stomach upset.  Follow-up with your PCP as soon as possible to discuss further management of your diabetes.  Return the ER for any uncontrolled nausea or vomiting, dizziness, any other new or concerning symptoms.

## 2023-07-31 NOTE — ED Notes (Signed)
In to dc pt and pt became upset and wanted his blood sugar down right now. Pa aware and talking with pt now.

## 2023-07-31 NOTE — ED Provider Notes (Signed)
[place] EMERGENCY DEPARTMENT AT [facility] Provider Note   CSN: [id number] Arrival date & time: 07/31/23  1507     History  Chief Complaint  Patient presents with   Hyperglycemia    Edward Roman is a 42 y.o. male with history of type 2 diabetes, hypertension, presents with concern for an elevated blood glucose reading earlier today.  States the reading was at 394.  He also notes urinating more frequently within the last week.  He just ran out of his metformin, has not been taking it for about the last week or so.  He also notes the metformin can sometimes make him feel not very well, and his primary was going to switch him to Januvia.  He states this medication was very expensive at the pharmacy and so has not changed to this medication yet.  Denies any nausea or vomiting, dizziness, any other complaints.   Hyperglycemia      Home Medications Prior to Admission medications   Medication Sig Start Date End Date Taking? Authorizing Provider  metFORMIN (GLUCOPHAGE) 500 MG tablet Take 1 tablet (500 mg total) by mouth 2 (two) times daily with a meal. 07/31/23 10/29/23 Yes Arabella Merles, PA-C  atorvastatin (LIPITOR) 20 MG tablet Take 1 tablet (20 mg total) by mouth daily. 09/18/21   Jacquelin Hawking, PA-C  azithromycin (ZITHROMAX) 250 MG tablet Take 1 tablet (250 mg total) by mouth daily. Take 1 tablet daily for 4 days 07/30/22   Burgess Amor, PA-C  brompheniramine-pseudoephedrine-DM 30-2-10 MG/5ML syrup Take 5 mLs by mouth 4 (four) times daily as needed. 01/26/23   Leath-Warren, Sadie Haber, NP  cetirizine (ZYRTEC) 10 MG tablet Take 1 tablet (10 mg total) by mouth daily. 01/26/23   Leath-Warren, Sadie Haber, NP  fluticasone (FLONASE) 50 MCG/ACT nasal spray Place 2 sprays into both nostrils daily. 01/26/23   Leath-Warren, Sadie Haber, NP  losartan (COZAAR) 50 MG tablet Take 1 tablet (50 mg total) by mouth daily. 09/18/21   Jacquelin Hawking, PA-C  metFORMIN (GLUCOPHAGE) 500 MG  tablet Take 1 tablet (500 mg total) by mouth 2 (two) times daily with a meal. 09/18/21   Jacquelin Hawking, PA-C  promethazine-dextromethorphan (PROMETHAZINE-DM) 6.25-15 MG/5ML syrup Take 5 mLs by mouth 4 (four) times daily as needed. 07/07/23   Particia Nearing, PA-C  hydrochlorothiazide (HYDRODIURIL) 25 MG tablet Take 1 tablet (25 mg total) by mouth daily. Patient not taking: Reported on 11/24/2019 10/08/18 10/05/20  Ivery Quale, PA-C      Allergies    Penicillin g    Review of Systems   Review of Systems  Physical Exam Updated Vital Signs BP (!) 130/98 (BP Location: Right Arm)   Pulse 84   Temp 98.1 F (36.7 C) (Oral)   Resp 17   Ht 5\' 6"  (1.676 m)   Wt 80.3 kg   SpO2 99%   BMI 28.57 kg/m  Physical Exam Vitals and nursing note reviewed.  Constitutional:      Appearance: Normal appearance.     Comments: Alert and oriented x4, well-appearing, no acute distress, no emesis  HENT:     Head: Atraumatic.     Mouth/Throat:     Mouth: Mucous membranes are moist.  Cardiovascular:     Rate and Rhythm: Normal rate and regular rhythm.  Pulmonary:     Effort: Pulmonary effort is normal.  Abdominal:     General: Abdomen is flat.     Palpations: Abdomen is soft.  Skin:    General:  Skin is warm and dry.  Neurological:     General: No focal deficit present.     Mental Status: He is alert.  Psychiatric:        Mood and Affect: Mood normal.        Behavior: Behavior normal.     ED Results / Procedures / Treatments   Labs (all labs ordered are listed, but only abnormal results are displayed) Labs Reviewed  URINALYSIS, ROUTINE W REFLEX MICROSCOPIC - Abnormal; Notable for the following components:      Result Value   Color, Urine STRAW (*)    Glucose, UA >=500 (*)    All other components within normal limits  BASIC METABOLIC PANEL - Abnormal; Notable for the following components:   Glucose, Bld 340 (*)    All other components within normal limits  CBG MONITORING, ED -  Abnormal; Notable for the following components:   Glucose-Capillary 336 (*)    All other components within normal limits  CBC    EKG None  Radiology No results found.  Procedures Procedures    Medications Ordered in ED Medications - No data to display  ED Course/ Medical Decision Making/ A&P                                 Medical Decision Making Amount and/or Complexity of Data Reviewed Labs: ordered.   42 y.o. male with pertinent past medical history of hypertension, type 2 diabetes presents to the ED for concern of elevated blood sugar reading of 394 earlier today  Differential diagnosis includes but is not limited to hyperglycemia, DKA, HHS  ED Course:  Patient reports elevated blood sugar reading at home but no other symptoms besides increased urination.  Denies any other feelings of unwellness, nausea or vomiting.  Vital signs stable here today.  CBC, BMP unremarkable aside from the elevated glucose of 340.  No concern for DKA or HHS today.  Urinalysis without any signs of infection.  Does not appear dry on exam, no indication for IV fluids at this time.  Since he is running out of his metformin, we will provide a refill for him to continue taking.  Discussed that he is to follow-up with his PCP for further management of his blood sugars.  Keep well-hydrated with fluids at home.   Impression: Hyperglycemia  Disposition:  The patient was discharged home with instructions to follow-up with PCP, take metformin as prescribed, keep well-hydrated at home. Return precautions given.  Lab Tests: I Ordered, and personally interpreted labs.  The pertinent results include:   CBC unremarkable BMP unremarkable aside from elevated glucose at 340.  No anion gap Urinalysis with elevated glucose, no other abnormalities           Final Clinical Impression(s) / ED Diagnoses Final diagnoses:  Hyperglycemia    Rx / DC Orders ED Discharge Orders          Ordered     metFORMIN (GLUCOPHAGE) 500 MG tablet  2 times daily with meals        07/31/23 1843              Arabella Merles, PA-C 07/31/23 1853    Vanetta Mulders, MD 08/01/23 2322

## 2023-08-03 DIAGNOSIS — E1165 Type 2 diabetes mellitus with hyperglycemia: Secondary | ICD-10-CM | POA: Diagnosis not present

## 2023-08-15 DIAGNOSIS — E1165 Type 2 diabetes mellitus with hyperglycemia: Secondary | ICD-10-CM | POA: Diagnosis not present

## 2023-08-21 ENCOUNTER — Ambulatory Visit
Admission: EM | Admit: 2023-08-21 | Discharge: 2023-08-21 | Disposition: A | Discharge status: Home / Self Care | Payer: 59

## 2023-08-21 ENCOUNTER — Other Ambulatory Visit: Payer: Self-pay

## 2023-08-21 ENCOUNTER — Ambulatory Visit: Payer: 59

## 2023-08-21 ENCOUNTER — Telehealth: Payer: Self-pay | Admitting: Family Medicine

## 2023-08-21 DIAGNOSIS — R058 Other specified cough: Secondary | ICD-10-CM | POA: Diagnosis not present

## 2023-08-21 DIAGNOSIS — M549 Dorsalgia, unspecified: Secondary | ICD-10-CM

## 2023-08-21 DIAGNOSIS — M25561 Pain in right knee: Secondary | ICD-10-CM

## 2023-08-21 DIAGNOSIS — R051 Acute cough: Secondary | ICD-10-CM | POA: Diagnosis not present

## 2023-08-21 MED ORDER — PROMETHAZINE-DM 6.25-15 MG/5ML PO SYRP: 5.0000 mL | ORAL_SOLUTION | Freq: Four times a day (QID) | ORAL | 0 refills | Status: DC | PRN

## 2023-08-21 MED ORDER — IBUPROFEN 800 MG PO TABS: 800.0000 mg | ORAL_TABLET | Freq: Three times a day (TID) | ORAL | 0 refills | Status: DC | PRN

## 2023-08-21 NOTE — Telephone Encounter (Signed)
Cough syrup sent for patient's cough symptoms related to his visit today.  Patient was notified.

## 2023-08-21 NOTE — ED Provider Notes (Signed)
[facility]    CSN: [id number] Arrival date & time: 08/21/23  1254      History   Chief Complaint Chief Complaint  Patient presents with   Shortness of Breath    HPI Edward Roman is a 42 y.o. male.   Presenting today with 2-day history of right knee pain worse with weightbearing and movement.  States the pain is mainly just above the knee into the thigh.  Denies swelling, discoloration, decreased range of motion, numbness, tingling, weakness, known injury to the area.  Trying a knee brace and IcyHot with minimal relief.  Also having right mid to upper back pain worse with deep breaths and coughing for the past 3 days.  Has a productive cough additionally.  Denies fever, chills, wheezing, shortness of breath, abdominal pain, nausea vomiting or diarrhea.  Not tried anything for the symptoms.  No known history of chronic pulmonary disease.    Past Medical History:  Diagnosis Date   Diabetes mellitus without complication (HCC)    Hypertension     Patient Active Problem List   Diagnosis Date Noted   DM (diabetes mellitus) (HCC) 07/10/2021   HTN (hypertension) 07/10/2021    History reviewed. No pertinent surgical history.     Home Medications    Prior to Admission medications   Medication Sig Start Date End Date Taking? Authorizing Provider  fenofibrate (TRICOR) 145 MG tablet 1 tablet Orally Once a day for 30 days 06/05/23  Yes [provider]  ibuprofen (ADVIL) 800 MG tablet Take 1 tablet (800 mg total) by mouth every 8 (eight) hours as needed. 08/21/23  Yes Particia Nearing, PA-C  JANUVIA 100 MG tablet Take 1 tablet by mouth daily. 08/03/23  Yes [provider]  atorvastatin (LIPITOR) 20 MG tablet Take 1 tablet (20 mg total) by mouth daily. 09/18/21   Jacquelin Hawking, PA-C  azithromycin (ZITHROMAX) 250 MG tablet Take 1 tablet (250 mg total) by mouth daily. Take 1 tablet daily for 4 days 07/30/22   Burgess Amor, PA-C   brompheniramine-pseudoephedrine-DM 30-2-10 MG/5ML syrup Take 5 mLs by mouth 4 (four) times daily as needed. 01/26/23   Leath-Warren, Sadie Haber, NP  cetirizine (ZYRTEC) 10 MG tablet Take 1 tablet (10 mg total) by mouth daily. 01/26/23   Leath-Warren, Sadie Haber, NP  fluticasone (FLONASE) 50 MCG/ACT nasal spray Place 2 sprays into both nostrils daily. 01/26/23   Leath-Warren, Sadie Haber, NP  glipiZIDE (GLUCOTROL) 5 MG tablet Take 5 mg by mouth every morning.    [provider]  losartan (COZAAR) 50 MG tablet Take 1 tablet (50 mg total) by mouth daily. 09/18/21   Jacquelin Hawking, PA-C  metFORMIN (GLUCOPHAGE) 500 MG tablet Take 1 tablet (500 mg total) by mouth 2 (two) times daily with a meal. 09/18/21   Jacquelin Hawking, PA-C  metFORMIN (GLUCOPHAGE) 500 MG tablet Take 1 tablet (500 mg total) by mouth 2 (two) times daily with a meal. 07/31/23 10/29/23  Arabella Merles, PA-C  promethazine-dextromethorphan (PROMETHAZINE-DM) 6.25-15 MG/5ML syrup Take 5 mLs by mouth 4 (four) times daily as needed. 07/07/23   Particia Nearing, PA-C  hydrochlorothiazide (HYDRODIURIL) 25 MG tablet Take 1 tablet (25 mg total) by mouth daily. Patient not taking: Reported on 11/24/2019 10/08/18 10/05/20  Ivery Quale, PA-C    Family History Family History  Problem Relation Age of Onset   Hypertension Mother    Diabetes Mother    Cancer Father    Hypertension Father    Diabetes Father  Hypertension Other    Diabetes Other    Thyroid disease Other     Social History Social History   Tobacco Use   Smoking status: Never   Smokeless tobacco: Never  Vaping Use   Vaping status: Never Used  Substance Use Topics   Alcohol use: Not Currently   Drug use: No     Allergies   Penicillin g   Review of Systems Review of Systems HPI  Physical Exam Triage Vital Signs ED Triage Vitals  Encounter Vitals Group     BP 08/21/23 1313 (!) 137/91     Systolic BP Percentile --      Diastolic BP Percentile  --      Pulse Rate 08/21/23 1313 76     Resp 08/21/23 1313 19     Temp 08/21/23 1313 97.6 F (36.4 C)     Temp Source 08/21/23 1313 Oral     SpO2 08/21/23 1313 94 %     Weight --      Height --      Head Circumference --      Peak Flow --      Pain Score 08/21/23 1309 0     Pain Loc --      Pain Education --      Exclude from Growth Chart --    No data found.  Updated Vital Signs BP (!) 137/91 (BP Location: Right Arm) Comment: notified provider  Pulse 76   Temp 97.6 F (36.4 C) (Oral)   Resp 19   SpO2 94%   Visual Acuity Right Eye Distance:   Left Eye Distance:   Bilateral Distance:    Right Eye Near:   Left Eye Near:    Bilateral Near:     Physical Exam Vitals and nursing note reviewed.  Constitutional:      Appearance: He is well-developed.  HENT:     Head: Atraumatic.     Right Ear: External ear normal.     Left Ear: External ear normal.     Nose: Nose normal.     Mouth/Throat:     Pharynx: No oropharyngeal exudate or posterior oropharyngeal erythema.  Eyes:     Conjunctiva/sclera: Conjunctivae normal.     Pupils: Pupils are equal, round, and reactive to light.  Cardiovascular:     Rate and Rhythm: Normal rate and regular rhythm.  Pulmonary:     Effort: Pulmonary effort is normal. No respiratory distress.     Breath sounds: No wheezing or rales.  Chest:     Chest wall: No tenderness.  Musculoskeletal:        General: Tenderness present. No swelling. Normal range of motion.     Cervical back: Normal range of motion and neck supple.     Comments: Tender to palpation to the distal right quadricep muscles just above the patella.  No bony deformity palpable, range of motion of the right knee intact, no joint instability, negative drawer testing and McMurray's  Lymphadenopathy:     Cervical: No cervical adenopathy.  Skin:    General: Skin is warm and dry.     Findings: No bruising or erythema.  Neurological:     Mental Status: He is alert and oriented to  person, place, and time.     Motor: No weakness.     Gait: Gait normal.     Comments: Right lower extremity neurovascularly intact  Psychiatric:        Behavior: Behavior normal.  UC Treatments / Results  Labs (all labs ordered are listed, but only abnormal results are displayed) Labs Reviewed - No data to display  EKG   Radiology DG Chest 2 View  Result Date: 08/21/2023 CLINICAL DATA:  Productive cough. EXAM: CHEST - 2 VIEW COMPARISON:  July 30, 2022. FINDINGS: The heart size and mediastinal contours are within normal limits. Both lungs are clear. The visualized skeletal structures are unremarkable. IMPRESSION: No active cardiopulmonary disease. Electronically Signed   By: Lupita Raider M.D.   On: 08/21/2023 15:40    Procedures Procedures (including critical care time)  Medications Ordered in UC Medications - No data to display  Initial Impression / Assessment and Plan / UC Course  I have reviewed the triage vital signs and the nursing notes.  Pertinent labs & imaging results that were available during my care of the patient were reviewed by me and considered in my medical decision making (see chart for details).     Suspect patellofemoral syndrome for the right knee pain.  She with ibuprofen, massage, stretches, heat, rest.  Chest x-ray was negative, vital signs reassuring today and he is well-appearing and in no acute distress.  Suspect back pain from muscular soreness from coughing.  Will prescribe a cough syrup and the ibuprofen should help with this as well.  Heat and massage to this area additionally.  Follow-up for worsening symptoms.  Final Clinical Impressions(s) / UC Diagnoses   Final diagnoses:  Acute cough  Mid back pain  Acute pain of right knee     Discharge Instructions      I have sent in ibuprofen to take as needed for your knee pain.  Make sure to be stretching, doing massages, apply muscle rubs as needed additionally.  Follow-up if  your symptoms are not improving.  We will call you when your chest x-ray comes back to discuss a plan regarding this.    ED Prescriptions     Medication Sig Dispense Auth. Provider   ibuprofen (ADVIL) 800 MG tablet Take 1 tablet (800 mg total) by mouth every 8 (eight) hours as needed. 21 tablet Particia Nearing, [place]      PDMP not reviewed this encounter.   Particia Nearing, [place] 08/21/23 1631

## 2023-08-21 NOTE — ED Triage Notes (Signed)
Pt is here with right knee pain started Wednesday, pt also states when he takes a deep breaths that it severly hurts in his back this started Tuesday. Pt has not taken any meds to relieve discomfort.

## 2023-08-21 NOTE — Discharge Instructions (Signed)
I have sent in ibuprofen to take as needed for your knee pain.  Make sure to be stretching, doing massages, apply muscle rubs as needed additionally.  Follow-up if your symptoms are not improving.  We will call you when your chest x-ray comes back to discuss a plan regarding this.

## 2023-08-26 ENCOUNTER — Ambulatory Visit: Payer: 59 | Admitting: Nurse Practitioner

## 2023-08-26 DIAGNOSIS — E1165 Type 2 diabetes mellitus with hyperglycemia: Secondary | ICD-10-CM

## 2023-08-26 DIAGNOSIS — I10 Essential (primary) hypertension: Secondary | ICD-10-CM

## 2023-08-26 DIAGNOSIS — Z7984 Long term (current) use of oral hypoglycemic drugs: Secondary | ICD-10-CM

## 2023-08-26 DIAGNOSIS — E782 Mixed hyperlipidemia: Secondary | ICD-10-CM

## 2023-09-04 DIAGNOSIS — E1165 Type 2 diabetes mellitus with hyperglycemia: Secondary | ICD-10-CM | POA: Diagnosis not present

## 2023-09-04 DIAGNOSIS — R42 Dizziness and giddiness: Secondary | ICD-10-CM | POA: Diagnosis not present

## 2023-10-30 ENCOUNTER — Telehealth: Payer: Self-pay

## 2023-10-30 NOTE — Telephone Encounter (Signed)
Mr [facility] called [employer] office inquiring if the [facility] membership was still valid. Discussed with him that was for 1 year only and has ended.  Discussed with Mr. Tassy about appointments for follow up care at [facility]. He has no appointment showing for future. Per his request I texted the number for [facility] to him and encouraged him to call and make a follow up appointment.   Francee Nodal RN [name]

## 2023-12-18 ENCOUNTER — Emergency Department (HOSPITAL_COMMUNITY)
Admission: EM | Admit: 2023-12-18 | Discharge: 2023-12-18 | Disposition: A | Discharge status: Home / Self Care | Payer: 59 | Attending: Emergency Medicine | Admitting: Emergency Medicine

## 2023-12-18 ENCOUNTER — Encounter (HOSPITAL_COMMUNITY): Payer: Self-pay | Admitting: Emergency Medicine

## 2023-12-18 ENCOUNTER — Other Ambulatory Visit: Payer: Self-pay

## 2023-12-18 DIAGNOSIS — R059 Cough, unspecified: Secondary | ICD-10-CM | POA: Diagnosis present

## 2023-12-18 DIAGNOSIS — I10 Essential (primary) hypertension: Secondary | ICD-10-CM | POA: Insufficient documentation

## 2023-12-18 DIAGNOSIS — Z79899 Other long term (current) drug therapy: Secondary | ICD-10-CM | POA: Diagnosis not present

## 2023-12-18 DIAGNOSIS — Z7984 Long term (current) use of oral hypoglycemic drugs: Secondary | ICD-10-CM | POA: Insufficient documentation

## 2023-12-18 DIAGNOSIS — J101 Influenza due to other identified influenza virus with other respiratory manifestations: Secondary | ICD-10-CM | POA: Insufficient documentation

## 2023-12-18 DIAGNOSIS — E119 Type 2 diabetes mellitus without complications: Secondary | ICD-10-CM | POA: Diagnosis not present

## 2023-12-18 LAB — CBG MONITORING, ED: Glucose-Capillary: 284 mg/dL — ABNORMAL HIGH (ref 70–99)

## 2023-12-18 MED ORDER — ONDANSETRON 4 MG PO TBDP
4.0000 mg | ORAL_TABLET | Freq: Once | ORAL | Status: AC
  Administered 2023-12-18: 4 mg via ORAL
  Filled 2023-12-18: qty 1

## 2023-12-18 MED ORDER — ACETAMINOPHEN 325 MG PO TABS
650.0000 mg | ORAL_TABLET | Freq: Once | ORAL | Status: AC
  Administered 2023-12-18: 650 mg via ORAL
  Filled 2023-12-18: qty 2

## 2023-12-18 MED ORDER — SODIUM CHLORIDE 0.9 % IV BOLUS
500.0000 mL | Freq: Once | INTRAVENOUS | Status: AC
  Administered 2023-12-18: 500 mL via INTRAVENOUS

## 2023-12-18 MED ORDER — ONDANSETRON HCL 4 MG PO TABS: 4.0000 mg | ORAL_TABLET | Freq: Four times a day (QID) | ORAL | 0 refills | Status: DC

## 2023-12-18 NOTE — ED Triage Notes (Signed)
Pt endorses cough, congestion and weakness since Tuesday. Tested + for Flu A on Tuesday. States he feels dehydrated. Tolerating PO.

## 2023-12-18 NOTE — ED Provider Notes (Signed)
[facility] Provider Note   CSN: [id number] Arrival date & time: 12/18/23  1643     History  Chief Complaint  Patient presents with   Cough   Weakness    Edward Roman is a 43 y.o. male with history of diabetes and hypertension.  Patient presents to ED for evaluation of flulike symptoms.  Patient states that on Tuesday he went to his PCP and was diagnosed with the flu.  States that ever since then he has had worsening nausea, vomiting.  States he feels as if he is dehydrated.  Denies any diarrhea, abdominal pain, chest pain, shortness of breath.  Endorsing fevers, body aches and chills at home.  States took Tylenol about 6 hours ago.  Reports he is here because he "needs to feel well for church on Sunday".   Cough Associated symptoms: fever   Associated symptoms: no chest pain and no shortness of breath   Weakness Associated symptoms: cough, fever, nausea and vomiting   Associated symptoms: no abdominal pain, no chest pain, no diarrhea and no shortness of breath        Home Medications Prior to Admission medications   Medication Sig Start Date End Date Taking? Authorizing Provider  ondansetron (ZOFRAN) 4 MG tablet Take 1 tablet (4 mg total) by mouth every 6 (six) hours. 12/18/23  Yes Al Decant, PA-C  atorvastatin (LIPITOR) 20 MG tablet Take 1 tablet (20 mg total) by mouth daily. 09/18/21   Jacquelin Hawking, PA-C  azithromycin (ZITHROMAX) 250 MG tablet Take 1 tablet (250 mg total) by mouth daily. Take 1 tablet daily for 4 days 07/30/22   Burgess Amor, PA-C  brompheniramine-pseudoephedrine-DM 30-2-10 MG/5ML syrup Take 5 mLs by mouth 4 (four) times daily as needed. 01/26/23   Leath-Warren, Sadie Haber, NP  cetirizine (ZYRTEC) 10 MG tablet Take 1 tablet (10 mg total) by mouth daily. 01/26/23   Leath-Warren, Sadie Haber, NP  fenofibrate (TRICOR) 145 MG tablet 1 tablet Orally Once a day for 30 days 06/05/23   [provider]   fluticasone (FLONASE) 50 MCG/ACT nasal spray Place 2 sprays into both nostrils daily. 01/26/23   Leath-Warren, Sadie Haber, NP  glipiZIDE (GLUCOTROL) 5 MG tablet Take 5 mg by mouth every morning.    [provider]  ibuprofen (ADVIL) 800 MG tablet Take 1 tablet (800 mg total) by mouth every 8 (eight) hours as needed. 08/21/23   Particia Nearing, PA-C  JANUVIA 100 MG tablet Take 1 tablet by mouth daily. 08/03/23   [provider]  losartan (COZAAR) 50 MG tablet Take 1 tablet (50 mg total) by mouth daily. 09/18/21   Jacquelin Hawking, PA-C  metFORMIN (GLUCOPHAGE) 500 MG tablet Take 1 tablet (500 mg total) by mouth 2 (two) times daily with a meal. 09/18/21   Jacquelin Hawking, PA-C  metFORMIN (GLUCOPHAGE) 500 MG tablet Take 1 tablet (500 mg total) by mouth 2 (two) times daily with a meal. 07/31/23 10/29/23  Arabella Merles, PA-C  promethazine-dextromethorphan (PROMETHAZINE-DM) 6.25-15 MG/5ML syrup Take 5 mLs by mouth 4 (four) times daily as needed. 07/07/23   Particia Nearing, PA-C  promethazine-dextromethorphan (PROMETHAZINE-DM) 6.25-15 MG/5ML syrup Take 5 mLs by mouth 4 (four) times daily as needed. 08/21/23   Particia Nearing, PA-C  hydrochlorothiazide (HYDRODIURIL) 25 MG tablet Take 1 tablet (25 mg total) by mouth daily. Patient not taking: Reported on 11/24/2019 10/08/18 10/05/20  Ivery Quale, PA-C      Allergies  Penicillin g    Review of Systems   Review of Systems  Constitutional:  Positive for fever.  Respiratory:  Positive for cough. Negative for shortness of breath.   Cardiovascular:  Negative for chest pain.  Gastrointestinal:  Positive for nausea and vomiting. Negative for abdominal pain and diarrhea.  Neurological:  Positive for weakness.  All other systems reviewed and are negative.   Physical Exam Updated Vital Signs BP (!) 144/100   Pulse (!) 115   Temp 100 F (37.8 C) (Oral)   Resp 18   SpO2 95%  Physical Exam Vitals and nursing note  reviewed.  Constitutional:      General: He is not in acute distress.    Appearance: He is well-developed.  HENT:     Head: Normocephalic and atraumatic.     Mouth/Throat:     Mouth: Mucous membranes are moist.     Pharynx: Posterior oropharyngeal erythema present. No oropharyngeal exudate.  Eyes:     Conjunctiva/sclera: Conjunctivae normal.  Cardiovascular:     Rate and Rhythm: Normal rate and regular rhythm.     Heart sounds: No murmur heard. Pulmonary:     Effort: Pulmonary effort is normal. No respiratory distress.     Breath sounds: Normal breath sounds.  Abdominal:     Palpations: Abdomen is soft.     Tenderness: There is no abdominal tenderness.  Musculoskeletal:        General: No swelling.     Cervical back: Neck supple.  Skin:    General: Skin is warm and dry.     Capillary Refill: Capillary refill takes less than 2 seconds.  Neurological:     Mental Status: He is alert.  Psychiatric:        Mood and Affect: Mood normal.     ED Results / Procedures / Treatments   Labs (all labs ordered are listed, but only abnormal results are displayed) Labs Reviewed  CBG MONITORING, ED - Abnormal; Notable for the following components:      Result Value   Glucose-Capillary 284 (*)    All other components within normal limits    EKG None  Radiology No results found.  Procedures Procedures    Medications Ordered in ED Medications  sodium chloride 0.9 % bolus 500 mL (0 mLs Intravenous Stopped 12/18/23 1828)  acetaminophen (TYLENOL) tablet 650 mg (650 mg Oral Given 12/18/23 1741)  ondansetron (ZOFRAN-ODT) disintegrating tablet 4 mg (4 mg Oral Given 12/18/23 1741)    ED Course/ Medical Decision Making/ A&P  Medical Decision Making Risk OTC drugs. Prescription drug management.   43 year old male presents for evaluation.  Please see HPI for further details.  On examination patient is tachycardic to 115, elevated temperature to 100 Fahrenheit.  Most likely as a  result of having the flu.  Abdomen soft and compressible with no tenderness noted.  Posterior oropharynx has erythema without exudate, uvula midline, handling secretions appropriately.  Neurological examinations at baseline.  Patient overall nontoxic in appearance.  Will provide patient 500 mL of fluid, Tylenol.  Will reassess.  CBG elevated to 84 in triage so I suspect the patient is able to tolerate p.o. intake.  On reassessment, patient reports that his symptoms have resolved.  Feels better at this time.  Will discharge patient home.  Advised to follow-up with his PCP.  Will send him home with Zofran.  Stable to discharge home.   Final Clinical Impression(s) / ED Diagnoses Final diagnoses:  Influenza A    Rx /  DC Orders ED Discharge Orders          Ordered    ondansetron (ZOFRAN) 4 MG tablet  Every 6 hours        12/18/23 1832              Al Decant, PA-C 12/18/23 1834    Loetta Rough, MD 12/18/23 2214

## 2023-12-18 NOTE — Discharge Instructions (Addendum)
It was a pleasure taking part in your care.  As discussed, your symptoms are result of you having the flu.  Please continue to hydrate yourself at home with Pedialyte, other electrolyte supplementation beverages.  Please take Tylenol or ibuprofen every 6 hours as needed for body aches and chills, fevers and headache.  Please eat a high-protein, low-fat diet.  If you go into public, please wear a mask.  Take Zofran every 6 hours as needed for nausea.  You may take Zicam to help shorten duration of symptoms.  Follow-up with PCP.  I have written you a work note.

## 2023-12-24 ENCOUNTER — Emergency Department (HOSPITAL_COMMUNITY): Payer: 59

## 2023-12-24 ENCOUNTER — Other Ambulatory Visit: Payer: Self-pay

## 2023-12-24 ENCOUNTER — Observation Stay (HOSPITAL_COMMUNITY)
Admission: EM | Admit: 2023-12-24 | Discharge: 2023-12-27 | Disposition: A | Discharge status: Home / Self Care | Payer: 59 | Attending: Family Medicine | Admitting: Family Medicine

## 2023-12-24 ENCOUNTER — Ambulatory Visit: Payer: 59 | Admitting: Nurse Practitioner

## 2023-12-24 DIAGNOSIS — R112 Nausea with vomiting, unspecified: Secondary | ICD-10-CM | POA: Diagnosis present

## 2023-12-24 DIAGNOSIS — R6510 Systemic inflammatory response syndrome (SIRS) of non-infectious origin without acute organ dysfunction: Secondary | ICD-10-CM | POA: Diagnosis not present

## 2023-12-24 DIAGNOSIS — I10 Essential (primary) hypertension: Secondary | ICD-10-CM | POA: Insufficient documentation

## 2023-12-24 DIAGNOSIS — J101 Influenza due to other identified influenza virus with other respiratory manifestations: Secondary | ICD-10-CM | POA: Diagnosis not present

## 2023-12-24 DIAGNOSIS — E1165 Type 2 diabetes mellitus with hyperglycemia: Secondary | ICD-10-CM | POA: Diagnosis not present

## 2023-12-24 DIAGNOSIS — E1110 Type 2 diabetes mellitus with ketoacidosis without coma: Secondary | ICD-10-CM | POA: Diagnosis not present

## 2023-12-24 DIAGNOSIS — A419 Sepsis, unspecified organism: Secondary | ICD-10-CM | POA: Insufficient documentation

## 2023-12-24 DIAGNOSIS — Z1152 Encounter for screening for COVID-19: Secondary | ICD-10-CM | POA: Diagnosis not present

## 2023-12-24 LAB — CBC WITH DIFFERENTIAL/PLATELET
Abs Immature Granulocytes: 0.25 10*3/uL — ABNORMAL HIGH (ref 0.00–0.07)
Basophils Absolute: 0 10*3/uL (ref 0.0–0.1)
Basophils Relative: 0 %
Eosinophils Absolute: 0 10*3/uL (ref 0.0–0.5)
Eosinophils Relative: 0 %
HCT: 33.5 % — ABNORMAL LOW (ref 39.0–52.0)
Hemoglobin: 11.3 g/dL — ABNORMAL LOW (ref 13.0–17.0)
Immature Granulocytes: 2 %
Lymphocytes Relative: 12 %
Lymphs Abs: 1.7 10*3/uL (ref 0.7–4.0)
MCH: 29.4 pg (ref 26.0–34.0)
MCHC: 33.7 g/dL (ref 30.0–36.0)
MCV: 87.2 fL (ref 80.0–100.0)
Monocytes Absolute: 1.7 10*3/uL — ABNORMAL HIGH (ref 0.1–1.0)
Monocytes Relative: 12 %
Neutro Abs: 10.7 10*3/uL — ABNORMAL HIGH (ref 1.7–7.7)
Neutrophils Relative %: 74 %
Platelets: 504 10*3/uL — ABNORMAL HIGH (ref 150–400)
RBC: 3.84 MIL/uL — ABNORMAL LOW (ref 4.22–5.81)
RDW: 11.9 % (ref 11.5–15.5)
WBC: 14.4 10*3/uL — ABNORMAL HIGH (ref 4.0–10.5)
nRBC: 0 % (ref 0.0–0.2)

## 2023-12-24 LAB — URINALYSIS, ROUTINE W REFLEX MICROSCOPIC
Bacteria, UA: NONE SEEN
Bilirubin Urine: NEGATIVE
Glucose, UA: >=500 mg/dL — AB
Ketones, ur: 20 mg/dL — AB
Leukocytes,Ua: NEGATIVE
Nitrite: NEGATIVE
Protein, ur: 30 mg/dL — AB
Specific Gravity, Urine: 1.023 (ref 1.005–1.030)
pH: 5 (ref 5.0–8.0)

## 2023-12-24 LAB — RESP PANEL BY RT-PCR (RSV, FLU A&B, COVID)  RVPGX2
Influenza A by PCR: POSITIVE — AB
Influenza B by PCR: NEGATIVE
Resp Syncytial Virus by PCR: NEGATIVE
SARS Coronavirus 2 by RT PCR: NEGATIVE

## 2023-12-24 LAB — CBG MONITORING, ED
Glucose-Capillary: 171 mg/dL — ABNORMAL HIGH (ref 70–99)
Glucose-Capillary: 322 mg/dL — ABNORMAL HIGH (ref 70–99)
Glucose-Capillary: 346 mg/dL — ABNORMAL HIGH (ref 70–99)
Glucose-Capillary: 346 mg/dL — ABNORMAL HIGH (ref 70–99)
Glucose-Capillary: 363 mg/dL — ABNORMAL HIGH (ref 70–99)

## 2023-12-24 LAB — COMPREHENSIVE METABOLIC PANEL
ALT: 21 U/L (ref 0–44)
AST: 16 U/L (ref 15–41)
Albumin: 3.4 g/dL — ABNORMAL LOW (ref 3.5–5.0)
Alkaline Phosphatase: 106 U/L (ref 38–126)
Anion gap: 18 — ABNORMAL HIGH (ref 5–15)
BUN: 16 mg/dL (ref 6–20)
CO2: 19 mmol/L — ABNORMAL LOW (ref 22–32)
Calcium: 9.6 mg/dL (ref 8.9–10.3)
Chloride: 95 mmol/L — ABNORMAL LOW (ref 98–111)
Creatinine, Ser: 1.09 mg/dL (ref 0.61–1.24)
GFR, Estimated: >60 mL/min (ref >60)
Glucose, Bld: 365 mg/dL — ABNORMAL HIGH (ref 70–99)
Potassium: 4.9 mmol/L (ref 3.5–5.1)
Sodium: 132 mmol/L — ABNORMAL LOW (ref 135–145)
Total Bilirubin: 2 mg/dL — ABNORMAL HIGH (ref 0.0–1.2)
Total Protein: 8.9 g/dL — ABNORMAL HIGH (ref 6.5–8.1)

## 2023-12-24 LAB — BLOOD GAS, VENOUS
Acid-base deficit: 0.3 mmol/L (ref 0.0–2.0)
Bicarbonate: 22.8 mmol/L (ref 20.0–28.0)
Drawn by: 4237
O2 Saturation: 19 %
Patient temperature: 38.4
pCO2, Ven: 34 mm[Hg] — ABNORMAL LOW (ref 44–60)
pH, Ven: 7.44 — ABNORMAL HIGH (ref 7.25–7.43)
pO2, Ven: <31 mm[Hg] — CL (ref 32–45)

## 2023-12-24 LAB — BASIC METABOLIC PANEL WITH GFR
Anion gap: 15 (ref 5–15)
BUN: 16 mg/dL (ref 6–20)
CO2: 19 mmol/L — ABNORMAL LOW (ref 22–32)
Calcium: 9.1 mg/dL (ref 8.9–10.3)
Chloride: 97 mmol/L — ABNORMAL LOW (ref 98–111)
Creatinine, Ser: 1.03 mg/dL (ref 0.61–1.24)
GFR, Estimated: >60 mL/min
Glucose, Bld: 367 mg/dL — ABNORMAL HIGH (ref 70–99)
Potassium: 3.7 mmol/L (ref 3.5–5.1)
Sodium: 131 mmol/L — ABNORMAL LOW (ref 135–145)

## 2023-12-24 LAB — BETA-HYDROXYBUTYRIC ACID: Beta-Hydroxybutyric Acid: 4.32 mmol/L — ABNORMAL HIGH (ref 0.05–0.27)

## 2023-12-24 MED ORDER — INSULIN REGULAR(HUMAN) IN NACL 100-0.9 UT/100ML-% IV SOLN
INTRAVENOUS | Status: DC
  Administered 2023-12-24: 17 [IU]/h via INTRAVENOUS
  Filled 2023-12-24: qty 100

## 2023-12-24 MED ORDER — LACTATED RINGERS IV SOLN: INTRAVENOUS | Status: DC

## 2023-12-24 MED ORDER — LACTATED RINGERS IV BOLUS
20.0000 mL/kg | Freq: Once | INTRAVENOUS | Status: AC
  Administered 2023-12-24: 1606 mL via INTRAVENOUS

## 2023-12-24 MED ORDER — INSULIN REGULAR(HUMAN) IN NACL 100-0.9 UT/100ML-% IV SOLN
INTRAVENOUS | Status: DC
  Filled 2023-12-24 (×2): qty 100

## 2023-12-24 MED ORDER — DEXTROSE IN LACTATED RINGERS 5 % IV SOLN: INTRAVENOUS | Status: AC

## 2023-12-24 MED ORDER — DEXTROSE IN LACTATED RINGERS 5 % IV SOLN: INTRAVENOUS | Status: DC

## 2023-12-24 MED ORDER — ENOXAPARIN SODIUM 40 MG/0.4ML IJ SOSY
40.0000 mg | PREFILLED_SYRINGE | INTRAMUSCULAR | Status: DC
  Administered 2023-12-25 – 2023-12-27 (×3): 40 mg via SUBCUTANEOUS
  Filled 2023-12-24 (×3): qty 0.4

## 2023-12-24 MED ORDER — ONDANSETRON HCL 4 MG/2ML IJ SOLN
4.0000 mg | Freq: Once | INTRAMUSCULAR | Status: AC
  Administered 2023-12-24: 4 mg via INTRAVENOUS
  Filled 2023-12-24: qty 2

## 2023-12-24 MED ORDER — ACETAMINOPHEN 500 MG PO TABS
1000.0000 mg | ORAL_TABLET | Freq: Once | ORAL | Status: AC
  Administered 2023-12-24: 1000 mg via ORAL
  Filled 2023-12-24: qty 2

## 2023-12-24 MED ORDER — GUAIFENESIN-DM 100-10 MG/5ML PO SYRP
15.0000 mL | ORAL_SOLUTION | Freq: Three times a day (TID) | ORAL | Status: DC | PRN
  Administered 2023-12-25 – 2023-12-27 (×3): 15 mL via ORAL
  Filled 2023-12-24 (×3): qty 15

## 2023-12-24 MED ORDER — DEXTROSE 50 % IV SOLN: 0.0000 mL | INTRAVENOUS | Status: DC | PRN

## 2023-12-24 MED ORDER — GUAIFENESIN-DM 100-10 MG/5ML PO SYRP: 15.0000 mL | ORAL_SOLUTION | Freq: Three times a day (TID) | ORAL | Status: DC

## 2023-12-24 NOTE — ED Provider Notes (Signed)
[place] EMERGENCY DEPARTMENT AT [facility] Provider Note   CSN: [id number] Arrival date & time: 12/24/23  1625     History  Chief Complaint  Patient presents with   Hyperglycemia    Edward Roman is a 43 y.o. male with PMHx HTN, DM type 2 who presents to ED concerned for hyperglycemia. EMS noting that CBG was 496. Patient also with nausea, vomiting, diarrhea, and generalized weakness over this past week and was diagnosed with Flu by his PCP 9 days ago. This is the 3rd time that patient is being seen in the ED this week for the same symptoms. Patient stating that he has an apt with his PCP in 5 days to start insulin.   Hyperglycemia      Home Medications Prior to Admission medications   Medication Sig Start Date End Date Taking? Authorizing Provider  glipiZIDE (GLUCOTROL) 10 MG tablet Take 10 mg by mouth every morning.   Yes [provider]  JANUVIA 100 MG tablet Take 1 tablet by mouth daily. 08/03/23  Yes [provider]  metFORMIN (GLUCOPHAGE) 1000 MG tablet Take 1,000 mg by mouth 2 (two) times daily with a meal. 12/16/23  Yes [provider]  hydrochlorothiazide (HYDRODIURIL) 25 MG tablet Take 1 tablet (25 mg total) by mouth daily. Patient not taking: Reported on 11/24/2019 10/08/18 10/05/20  Ivery Quale, PA-C      Allergies    Penicillin g    Review of Systems   Review of Systems  Endocrine:       Hyperglycemia    Physical Exam Updated Vital Signs BP 113/67   Pulse 94   Temp 98.2 F (36.8 C)   Resp 19   Ht 5\' 7"  (1.702 m)   Wt 80.3 kg   SpO2 93%   BMI 27.72 kg/m  Physical Exam Vitals and nursing note reviewed.  Constitutional:      General: He is not in acute distress.    Appearance: He is not ill-appearing or toxic-appearing.  HENT:     Head: Normocephalic and atraumatic.     Mouth/Throat:     Mouth: Mucous membranes are moist.  Eyes:     General: No scleral icterus.       Right eye: No discharge.         Left eye: No discharge.     Conjunctiva/sclera: Conjunctivae normal.  Cardiovascular:     Rate and Rhythm: Regular rhythm. Tachycardia present.     Pulses: Normal pulses.     Heart sounds: Normal heart sounds. No murmur heard. Pulmonary:     Effort: Pulmonary effort is normal. No respiratory distress.     Breath sounds: Normal breath sounds. No wheezing, rhonchi or rales.  Abdominal:     Tenderness: There is no abdominal tenderness.  Musculoskeletal:     Right lower leg: No edema.     Left lower leg: No edema.  Skin:    General: Skin is warm and dry.     Findings: No rash.  Neurological:     General: No focal deficit present.     Mental Status: He is alert and oriented to person, place, and time. Mental status is at baseline.  Psychiatric:        Mood and Affect: Mood normal.        Behavior: Behavior normal.     ED Results / Procedures / Treatments   Labs (all labs ordered are listed, but only abnormal results are displayed) Labs Reviewed  CBC WITH DIFFERENTIAL/PLATELET - Abnormal; Notable for the following components:      Result Value   WBC 14.4 (*)    RBC 3.84 (*)    Hemoglobin 11.3 (*)    HCT 33.5 (*)    Platelets 504 (*)    Neutro Abs 10.7 (*)    Monocytes Absolute 1.7 (*)    Abs Immature Granulocytes 0.25 (*)    All other components within normal limits  COMPREHENSIVE METABOLIC PANEL - Abnormal; Notable for the following components:   Sodium 132 (*)    Chloride 95 (*)    CO2 19 (*)    Glucose, Bld 365 (*)    Total Protein 8.9 (*)    Albumin 3.4 (*)    Total Bilirubin 2.0 (*)    Anion gap 18 (*)    All other components within normal limits  BETA-HYDROXYBUTYRIC ACID - Abnormal; Notable for the following components:   Beta-Hydroxybutyric Acid 4.32 (*)    All other components within normal limits  URINALYSIS, ROUTINE W REFLEX MICROSCOPIC - Abnormal; Notable for the following components:   Glucose, UA >=500 (*)    Hgb urine dipstick SMALL (*)    Ketones, ur  20 (*)    Protein, ur 30 (*)    All other components within normal limits  BLOOD GAS, VENOUS - Abnormal; Notable for the following components:   pH, Ven 7.44 (*)    pCO2, Ven 34 (*)    pO2, Ven <31 (*)    All other components within normal limits  CBG MONITORING, ED - Abnormal; Notable for the following components:   Glucose-Capillary 346 (*)    All other components within normal limits  CBG MONITORING, ED - Abnormal; Notable for the following components:   Glucose-Capillary 322 (*)    All other components within normal limits  CBG MONITORING, ED - Abnormal; Notable for the following components:   Glucose-Capillary 363 (*)    All other components within normal limits  BASIC METABOLIC PANEL  BASIC METABOLIC PANEL  BASIC METABOLIC PANEL    EKG None  Radiology DG Chest 2 View Result Date: 12/24/2023 CLINICAL DATA:  Cough.  Recent flu positive. EXAM: CHEST - 2 VIEW COMPARISON:  08/21/2023 FINDINGS: The cardiomediastinal contours are normal. The lungs are clear. Pulmonary vasculature is normal. No consolidation, pleural effusion, or pneumothorax. No acute osseous abnormalities are seen. IMPRESSION: No active cardiopulmonary disease. Electronically Signed   By: Narda Rutherford M.D.   On: 12/24/2023 17:27    Procedures .Critical Care  Performed by: Dorthy Cooler, PA-C Authorized by: Dorthy Cooler, PA-C   Critical care provider statement:    Critical care time (minutes):  30   Critical care was necessary to treat or prevent imminent or life-threatening deterioration of the following conditions:  Endocrine crisis   Critical care was time spent personally by me on the following activities:  Development of treatment plan with patient or surrogate, discussions with consultants, evaluation of patient's response to treatment, examination of patient, ordering and review of laboratory studies, ordering and review of radiographic studies, ordering and performing treatments and  interventions, pulse oximetry, re-evaluation of patient's condition and review of old charts   Care discussed with: admitting provider   Comments:     DKA     Medications Ordered in ED Medications  insulin regular, human (MYXREDLIN) 100 units/ 100 mL infusion (17 Units/hr Intravenous New Bag/Given 12/24/23 2122)  lactated ringers infusion ( Intravenous New Bag/Given 12/24/23 2123)  dextrose 5 %  in lactated ringers infusion (has no administration in time range)  dextrose 50 % solution 0-50 mL (has no administration in time range)  lactated ringers bolus 1,606 mL (0 mLs Intravenous Stopped 12/24/23 2107)  acetaminophen (TYLENOL) tablet 1,000 mg (1,000 mg Oral Given 12/24/23 1824)  ondansetron (ZOFRAN) injection 4 mg (4 mg Intravenous Given 12/24/23 1824)    ED Course/ Medical Decision Making/ A&P                                 Medical Decision Making Amount and/or Complexity of Data Reviewed Labs: ordered. Radiology: ordered.  Risk OTC drugs. Prescription drug management.   This patient presents to the ED for concern of weakness, dehydration, cough, nausea, vomiting, diarrhea, this involves an extensive number of treatment options, and is a complaint that carries with it a high risk of complications and morbidity.  The differential diagnosis includes hypoglycemia/hyperglycemia, DKA,, electrolyte abnormality, sepsis, viral illness, anemia   Co morbidities that complicate the patient evaluation  DM type 2, HTN   Additional history obtained:  PCP with [facility]   Problem List / ED Course / Critical interventions / Medication management  Patient presents to ED concerned for hyperglycemia, fever, cough, nausea, vomiting, diarrhea, generalized weakness x 9 days.  Patient diagnosed with flu by his PCP last week.  EMS stating the patient's CBG was 496 upon arrival.  Physical exam with tachycardia and fever initially - these resolved with tylenol and IV  fluids.  Rest of physical exam reassuring. I Ordered, and personally interpreted labs.  CMP with mild hyponatremia at 132, bicarb low at 19, chloride low at 95.  There is also anion gap at 18.  Patient hyperglycemic at 365.  UA not concerning for infection.  CBC with leukocytosis of 14.4.  There is also mild anemia with hemoglobin at 11.3.  Beta hydroxybutyrate acid elevated at 4.32. The patient was maintained on a cardiac monitor.  I personally viewed and interpreted the EKG/cardiac monitored which showed an underlying rhythm of: Sinus rhythm I ordered imaging studies including chest x-ray to assess for process contributing patient's symptoms. I independently visualized and interpreted imaging which showed no acute process. I agree with the radiologist interpretation. Started patient on IV fluids and endotool. Shared decision making with patient who states that he is still not feeling very good and would like to be admitted for at least one day. I also believe it is in patient's best interest to be admitted for his DKA.  Staffed with Dr. Estell Harpin.  I have reviewed the patients home medicines and have made adjustments as needed Dr. Mariea Clonts admitting provider.    Social Determinants of Health:  none         Final Clinical Impression(s) / ED Diagnoses Final diagnoses:  Diabetic ketoacidosis without coma associated with type 2 diabetes mellitus [facility])    Rx / DC Orders ED Discharge Orders     None         Margarita Rana 12/24/23 2137    Bethann Berkshire, MD 12/25/23 1133

## 2023-12-24 NOTE — Assessment & Plan Note (Signed)
Stable.  Not on medication. 

## 2023-12-24 NOTE — Assessment & Plan Note (Signed)
DKA in the setting of likely viral infection, SIRS.  Blood sugar 365, anion gap 18, serum bicarb of 19, with normal VBG-7.4, pCO2 of 34.  Reports compliance with glipizide, Januvia, metformin. - HgbA1c -Continue insulin drip, per Endo tool protocol - 1.6 L bolus given, continue LR 125cc/hr  -Switch to D5 fluids when blood sugar is less than 250 -Replete electrolytes -BMP every 4 hourly -Diabetes coordinator consult

## 2023-12-24 NOTE — Assessment & Plan Note (Signed)
Febrile to 101.2.  Tachycardic to 93- 124, with leukocytosis of 14.4.  Reports recent flu infection persistent symptoms.  Abdomen is benign.  UA and chest x-ray not suggestive of infection.  Leukocytosis may be stress reaction. -COVID/flu/RSV test -Hold off on antibiotics for now -If negative for COVID/flu, will need blood cultures and consider starting broad-spectrum antibiotics -IV fluids

## 2023-12-24 NOTE — ED Triage Notes (Signed)
Blood sugar 496 via EMS, type 2 diabetic, Not on insulin. States doctors are trying to get him on it but he hasn't. +Flu tuesday, N/V/D, weakness. A+Ox4.

## 2023-12-24 NOTE — H&P (Signed)
History and Physical    Edward Roman:[id number] DOB: 02-13-1981 DOA: 12/24/2023  PCP: Elmer Picker [facility]   Patient coming from: Home  I have personally briefly reviewed patient's old medical records in [facility]  Chief Complaint: Vomitng, Cough  HPI: Edward Roman is a 43 y.o. male with medical history significant for diabetes mellitus, hypertension.  Patient presented to the ED with complaints of cough, congestion, nausea and multiple episodes of vomiting over the past week.  No difficulty breathing.  No chest pain.  No lower extremity swelling redness or pain.  No abdominal pain.  No diarrhea, urinary symptoms. Reports high blood sugar with blurry vision.  He reports compliance with his medications which include glipizide, Januvia and metformin, was to follow-up with his primary care provider to start insulins. Reports recent positive flu test.  ED Course: Tmax 101.2.  Tachycardic heart rate 93-124.  Respiratory rate 13-19.  Blood pressure systolic 113-153.  O2 sats greater than 90% on room air. WBC 14.4. Blood sugar 365, with anion gap of 18 and serum bicarb of 19. Beta hydroxybutyrate elevated at 4.32. ABG shows pH of 7.4, pCO2 of 34. UA not suggestive of UTI, shows positive ketones and small protein. Chest x-ray negative for acute abnormality. Insulin drip started.  1.6 L bolus given.  Review of Systems: As per HPI all other systems reviewed and negative.  Past Medical History:  Diagnosis Date   Diabetes mellitus without complication (HCC)    Hypertension     No past surgical history on file.   reports that he has never smoked. He has never used smokeless tobacco. He reports that he does not currently use alcohol. He reports that he does not use drugs.  Allergies  Allergen Reactions   Penicillin G Hives    Family History  Problem Relation Age of Onset   Hypertension Mother    Diabetes Mother    Cancer Father    Hypertension  Father    Diabetes Father    Hypertension Other    Diabetes Other    Thyroid disease Other     Prior to Admission medications   Medication Sig Start Date End Date Taking? Authorizing Provider  glipiZIDE (GLUCOTROL) 10 MG tablet Take 10 mg by mouth every morning.   Yes [provider]  JANUVIA 100 MG tablet Take 1 tablet by mouth daily. 08/03/23  Yes [provider]  metFORMIN (GLUCOPHAGE) 1000 MG tablet Take 1,000 mg by mouth 2 (two) times daily with a meal. 12/16/23  Yes [provider]  hydrochlorothiazide (HYDRODIURIL) 25 MG tablet Take 1 tablet (25 mg total) by mouth daily. Patient not taking: Reported on 11/24/2019 10/08/18 10/05/20  Edward Quale, PA-C    Physical Exam: Vitals:   12/24/23 2000 12/24/23 2100 12/24/23 2130 12/24/23 2300  BP: 128/80 113/67  130/82  Pulse: (!) 105 94 93 98  Resp: 19 19 18 13   Temp: 98.2 F (36.8 C)     TempSrc:      SpO2: 95% 93% 95% 90%  Weight:      Height:        Constitutional: NAD, calm, comfortable Vitals:   12/24/23 2000 12/24/23 2100 12/24/23 2130 12/24/23 2300  BP: 128/80 113/67  130/82  Pulse: (!) 105 94 93 98  Resp: 19 19 18 13   Temp: 98.2 F (36.8 C)     TempSrc:      SpO2: 95% 93% 95% 90%  Weight:      Height:  Eyes: PERRL, lids and conjunctivae normal ENMT: Mucous membranes are moist.  Neck: normal, supple, no masses, no thyromegaly Respiratory: clear to auscultation bilaterally, no wheezing, no crackles. Normal respiratory effort. No accessory muscle use.  Cardiovascular: Tachycardic, regular rate and rhythm, no murmurs / rubs / gallops. No extremity edema.  Extremities warm.   Abdomen: no tenderness, no masses palpated. No hepatosplenomegaly. Bowel sounds positive.  Musculoskeletal: no clubbing / cyanosis. No joint deformity upper and lower extremities.  Skin: no rashes, lesions, ulcers. No induration Neurologic: No facial asymmetry, moving extremities spontaneously, speech  fluent Psychiatric: Normal judgment and insight. Alert and oriented x 3. Normal mood.   Labs on Admission: I have personally reviewed following labs and imaging studies  CBC: Recent Labs  Lab 12/24/23 1706  WBC 14.4*  NEUTROABS 10.7*  HGB 11.3*  HCT 33.5*  MCV 87.2  PLT 504*   Basic Metabolic Panel: Recent Labs  Lab 12/24/23 1706 12/24/23 2215  NA 132* 131*  K 4.9 3.7  CL 95* 97*  CO2 19* 19*  GLUCOSE 365* 367*  BUN 16 16  CREATININE 1.09 1.03  CALCIUM 9.6 9.1   GFR: Estimated Creatinine Clearance: 95.8 mL/min (by C-G formula based on SCr of 1.03 mg/dL). Liver Function Tests: Recent Labs  Lab 12/24/23 1706  AST 16  ALT 21  ALKPHOS 106  BILITOT 2.0*  PROT 8.9*  ALBUMIN 3.4*   CBG: Recent Labs  Lab 12/18/23 1709 12/24/23 1725 12/24/23 2013 12/24/23 2118 12/24/23 2225  GLUCAP 284* 346* 322* 363* 346*   Urine analysis:    Component Value Date/Time   COLORURINE YELLOW 12/24/2023 1802   APPEARANCEUR CLEAR 12/24/2023 1802   LABSPEC 1.023 12/24/2023 1802   PHURINE 5.0 12/24/2023 1802   GLUCOSEU >=500 (A) 12/24/2023 1802   HGBUR SMALL (A) 12/24/2023 1802   BILIRUBINUR NEGATIVE 12/24/2023 1802   KETONESUR 20 (A) 12/24/2023 1802   PROTEINUR 30 (A) 12/24/2023 1802   NITRITE NEGATIVE 12/24/2023 1802   LEUKOCYTESUR NEGATIVE 12/24/2023 1802    Radiological Exams on Admission: DG Chest 2 View Result Date: 12/24/2023 CLINICAL DATA:  Cough.  Recent flu positive. EXAM: CHEST - 2 VIEW COMPARISON:  08/21/2023 FINDINGS: The cardiomediastinal contours are normal. The lungs are clear. Pulmonary vasculature is normal. No consolidation, pleural effusion, or pneumothorax. No acute osseous abnormalities are seen. IMPRESSION: No active cardiopulmonary disease. Electronically Signed   By: Narda Rutherford M.D.   On: 12/24/2023 17:27    EKG: Independently reviewed.  Sinus tachycardia rate 116.  QTc 413.  No prior to compare.  No significant ST-T wave  abnormalities.  Assessment/Plan Principal Problem:   DKA (diabetic ketoacidosis) (HCC) Active Problems:   HTN (hypertension)   SIRS (systemic inflammatory response syndrome) (HCC)   Assessment and Plan: * DKA (diabetic ketoacidosis) (HCC) DKA in the setting of likely viral infection, SIRS.  Blood sugar 365, anion gap 18, serum bicarb of 19, with normal VBG-7.4, pCO2 of 34.  Reports compliance with glipizide, Januvia, metformin. - HgbA1c -Continue insulin drip, per Endo tool protocol - 1.6 L bolus given, continue LR 125cc/hr  -Switch to D5 fluids when blood sugar is less than 250 -Replete electrolytes -BMP every 4 hourly -Diabetes coordinator consult  SIRS (systemic inflammatory response syndrome) (HCC) Febrile to 101.2.  Tachycardic to 93- 124, with leukocytosis of 14.4.  Reports recent flu infection persistent symptoms.  Abdomen is benign.  UA and chest x-ray not suggestive of infection.  Leukocytosis may be stress reaction. -COVID/flu/RSV test -Hold off on antibiotics  for now -If negative for COVID/flu, will need blood cultures and consider starting broad-spectrum antibiotics -IV fluids  HTN (hypertension) Stable. -Not on medication   DVT prophylaxis: Lovenox Code Status: Full code Family Communication: None at bedside Disposition Plan: ~ 2 days Consults called: None Admission status: Inpt Stepdown I certify that at the point of admission it is my clinical judgment that the patient will require inpatient hospital care spanning beyond 2 midnights from the point of admission due to high intensity of service, high risk for further deterioration and high frequency of surveillance required.   CRITICAL CARE Performed by: Onnie Boer   Total critical care time: 70 minutes  Critical care time was exclusive of separately billable procedures and treating other patients.  Critical care was necessary to treat or prevent imminent or life-threatening  deterioration.  Critical care was time spent personally by me on the following activities: development of treatment plan with patient and/or surrogate as well as nursing, discussions with consultants, evaluation of patient's response to treatment, examination of patient, obtaining history from patient or surrogate, ordering and performing treatments and interventions, ordering and review of laboratory studies, ordering and review of radiographic studies, pulse oximetry and re-evaluation of patient's condition.    Author: Onnie Boer, MD 12/24/2023 11:43 PM  For on call review [email or web address].

## 2023-12-25 ENCOUNTER — Other Ambulatory Visit (HOSPITAL_COMMUNITY): Payer: Self-pay

## 2023-12-25 ENCOUNTER — Encounter (HOSPITAL_COMMUNITY): Payer: Self-pay | Admitting: Internal Medicine

## 2023-12-25 DIAGNOSIS — I10 Essential (primary) hypertension: Secondary | ICD-10-CM | POA: Diagnosis not present

## 2023-12-25 DIAGNOSIS — E1110 Type 2 diabetes mellitus with ketoacidosis without coma: Secondary | ICD-10-CM | POA: Diagnosis not present

## 2023-12-25 DIAGNOSIS — R6510 Systemic inflammatory response syndrome (SIRS) of non-infectious origin without acute organ dysfunction: Secondary | ICD-10-CM | POA: Diagnosis not present

## 2023-12-25 LAB — BASIC METABOLIC PANEL
Anion gap: 11 (ref 5–15)
Anion gap: 9 (ref 5–15)
Anion gap: 12 (ref 5–15)
Anion gap: 12 (ref 5–15)
BUN: 14 mg/dL (ref 6–20)
BUN: 12 mg/dL (ref 6–20)
BUN: 12 mg/dL (ref 6–20)
BUN: 12 mg/dL (ref 6–20)
CO2: 26 mmol/L (ref 22–32)
CO2: 27 mmol/L (ref 22–32)
CO2: 24 mmol/L (ref 22–32)
CO2: 25 mmol/L (ref 22–32)
Calcium: 9.4 mg/dL (ref 8.9–10.3)
Calcium: 9.3 mg/dL (ref 8.9–10.3)
Calcium: 9.1 mg/dL (ref 8.9–10.3)
Calcium: 9.1 mg/dL (ref 8.9–10.3)
Chloride: 99 mmol/L (ref 98–111)
Chloride: 100 mmol/L (ref 98–111)
Chloride: 99 mmol/L (ref 98–111)
Chloride: 97 mmol/L — ABNORMAL LOW (ref 98–111)
Creatinine, Ser: 0.86 mg/dL (ref 0.61–1.24)
Creatinine, Ser: 0.8 mg/dL (ref 0.61–1.24)
Creatinine, Ser: 0.78 mg/dL (ref 0.61–1.24)
Creatinine, Ser: 0.84 mg/dL (ref 0.61–1.24)
GFR, Estimated: >60 mL/min (ref >60)
GFR, Estimated: >60 mL/min (ref >60)
GFR, Estimated: >60 mL/min (ref >60)
GFR, Estimated: >60 mL/min (ref >60)
Glucose, Bld: 147 mg/dL — ABNORMAL HIGH (ref 70–99)
Glucose, Bld: 138 mg/dL — ABNORMAL HIGH (ref 70–99)
Glucose, Bld: 271 mg/dL — ABNORMAL HIGH (ref 70–99)
Glucose, Bld: 323 mg/dL — ABNORMAL HIGH (ref 70–99)
Potassium: 4.1 mmol/L (ref 3.5–5.1)
Potassium: 3.5 mmol/L (ref 3.5–5.1)
Potassium: 3.5 mmol/L (ref 3.5–5.1)
Potassium: 3.8 mmol/L (ref 3.5–5.1)
Sodium: 136 mmol/L (ref 135–145)
Sodium: 136 mmol/L (ref 135–145)
Sodium: 135 mmol/L (ref 135–145)
Sodium: 134 mmol/L — ABNORMAL LOW (ref 135–145)

## 2023-12-25 LAB — CBC
HCT: 28.1 % — ABNORMAL LOW (ref 39.0–52.0)
Hemoglobin: 9.9 g/dL — ABNORMAL LOW (ref 13.0–17.0)
MCH: 30.2 pg (ref 26.0–34.0)
MCHC: 35.2 g/dL (ref 30.0–36.0)
MCV: 85.7 fL (ref 80.0–100.0)
Platelets: 500 10*3/uL — ABNORMAL HIGH (ref 150–400)
RBC: 3.28 MIL/uL — ABNORMAL LOW (ref 4.22–5.81)
RDW: 11.9 % (ref 11.5–15.5)
WBC: 11.3 10*3/uL — ABNORMAL HIGH (ref 4.0–10.5)
nRBC: 0 % (ref 0.0–0.2)

## 2023-12-25 LAB — CBG MONITORING, ED
Glucose-Capillary: 138 mg/dL — ABNORMAL HIGH (ref 70–99)
Glucose-Capillary: 139 mg/dL — ABNORMAL HIGH (ref 70–99)
Glucose-Capillary: 143 mg/dL — ABNORMAL HIGH (ref 70–99)
Glucose-Capillary: 144 mg/dL — ABNORMAL HIGH (ref 70–99)
Glucose-Capillary: 166 mg/dL — ABNORMAL HIGH (ref 70–99)
Glucose-Capillary: 184 mg/dL — ABNORMAL HIGH (ref 70–99)
Glucose-Capillary: 301 mg/dL — ABNORMAL HIGH (ref 70–99)

## 2023-12-25 LAB — BETA-HYDROXYBUTYRIC ACID
Beta-Hydroxybutyric Acid: 0.4 mmol/L — ABNORMAL HIGH (ref 0.05–0.27)
Beta-Hydroxybutyric Acid: 0.08 mmol/L (ref 0.05–0.27)

## 2023-12-25 LAB — HIV ANTIBODY (ROUTINE TESTING W REFLEX): HIV Screen 4th Generation wRfx: NONREACTIVE

## 2023-12-25 LAB — GLUCOSE, CAPILLARY
Glucose-Capillary: 278 mg/dL — ABNORMAL HIGH (ref 70–99)
Glucose-Capillary: 278 mg/dL — ABNORMAL HIGH (ref 70–99)

## 2023-12-25 LAB — HEMOGLOBIN A1C
Hgb A1c MFr Bld: 9.5 % — ABNORMAL HIGH (ref 4.8–5.6)
Mean Plasma Glucose: 225.95 mg/dL

## 2023-12-25 MED ORDER — SALINE SPRAY 0.65 % NA SOLN: 1.0000 | NASAL | Status: DC | PRN

## 2023-12-25 MED ORDER — INSULIN GLARGINE-YFGN 100 UNIT/ML ~~LOC~~ SOPN: 15.0000 [IU] | PEN_INJECTOR | SUBCUTANEOUS | Status: DC

## 2023-12-25 MED ORDER — INSULIN ASPART 100 UNIT/ML IJ SOLN
0.0000 [IU] | Freq: Every day | INTRAMUSCULAR | Status: DC
  Administered 2023-12-25: 3 [IU] via SUBCUTANEOUS
  Administered 2023-12-26: 2 [IU] via SUBCUTANEOUS

## 2023-12-25 MED ORDER — FLUTICASONE PROPIONATE 50 MCG/ACT NA SUSP
1.0000 | Freq: Every day | NASAL | Status: DC
  Administered 2023-12-25 – 2023-12-27 (×3): 1 via NASAL
  Filled 2023-12-25: qty 16

## 2023-12-25 MED ORDER — INSULIN ASPART 100 UNIT/ML IJ SOLN
0.0000 [IU] | Freq: Three times a day (TID) | INTRAMUSCULAR | Status: DC
  Administered 2023-12-25: 5 [IU] via SUBCUTANEOUS
  Administered 2023-12-25: 7 [IU] via SUBCUTANEOUS
  Administered 2023-12-25: 2 [IU] via SUBCUTANEOUS
  Administered 2023-12-26 (×2): 3 [IU] via SUBCUTANEOUS
  Administered 2023-12-26 – 2023-12-27 (×2): 2 [IU] via SUBCUTANEOUS
  Administered 2023-12-27: 3 [IU] via SUBCUTANEOUS

## 2023-12-25 MED ORDER — INSULIN GLARGINE-YFGN 100 UNIT/ML ~~LOC~~ SOLN
15.0000 [IU] | Freq: Every day | SUBCUTANEOUS | Status: DC
  Administered 2023-12-25: 15 [IU] via SUBCUTANEOUS
  Filled 2023-12-25 (×2): qty 0.15

## 2023-12-25 MED ORDER — INSULIN ASPART 100 UNIT/ML IJ SOLN
3.0000 [IU] | Freq: Three times a day (TID) | INTRAMUSCULAR | Status: DC
  Administered 2023-12-25 – 2023-12-27 (×8): 3 [IU] via SUBCUTANEOUS
  Filled 2023-12-25: qty 1

## 2023-12-25 MED ORDER — INSULIN GLARGINE-YFGN 100 UNIT/ML ~~LOC~~ SOLN
6.0000 [IU] | Freq: Once | SUBCUTANEOUS | Status: AC
  Administered 2023-12-25: 6 [IU] via SUBCUTANEOUS
  Filled 2023-12-25: qty 0.06

## 2023-12-25 MED ORDER — INSULIN GLARGINE-YFGN 100 UNIT/ML ~~LOC~~ SOLN
20.0000 [IU] | Freq: Every day | SUBCUTANEOUS | Status: DC
  Filled 2023-12-25 (×2): qty 0.2

## 2023-12-25 NOTE — ED Notes (Signed)
Per endotool information previous RN messaged MD for transition orders from drip to subcutaneous.

## 2023-12-25 NOTE — ED Notes (Signed)
Pt given a sandwich at this time since switching to insulin subcutaneous with drip.

## 2023-12-25 NOTE — ED Notes (Signed)
Transition of Care [facility]) - Inpatient Brief Assessment   Patient Details  Name: Edward Roman MRN: [id number] Date of Birth: 1981-01-05  Transition of Care [facility]) CM/SW Contact:    Isabella Bowens, LCSWA Phone Number: 12/25/2023, 8:28 AM   Clinical Narrative:  Transition of Care Department [facility]) has reviewed patient and no TOC needs have been identified at this time. We will continue to monitor patient advancement through interdisciplinary progression rounds. If new patient transition needs arise, please place a TOC consult.  Transition of Care Asessment: Insurance and Status: Insurance coverage has been reviewed Patient has primary care physician: Yes Home environment has been reviewed: Single Family Home Prior level of function:: Independent Prior/Current Home Services: No current home services Social Drivers of Health Review: SDOH reviewed no interventions necessary Readmission risk has been reviewed: Yes Transition of care needs: no transition of care needs at this time

## 2023-12-25 NOTE — Inpatient Diabetes Management (Signed)
Inpatient Diabetes Program Recommendations  AACE/ADA: New Consensus Statement on Inpatient Glycemic Control (2015)  Target Ranges:  Prepandial:   less than 140 mg/dL      Peak postprandial:   less than 180 mg/dL (1-2 hours)      Critically ill patients:  140 - 180 mg/dL   Lab Results  Component Value Date   GLUCAP 301 (H) 12/25/2023   HGBA1C 9.5 (H) 12/24/2023      Discharge Recommendations: Intermediate acting recommendations: insulin isophane & regular (NOVOLIN 70/30 RELION PEN) ([employer] Only) 11 units bid (equivalent of 15 units of basal in a 24 hour period)  Supply/Referral recommendations: Glucometer Test strips Lancet device Lancets Pen needles - standard   Use Adult Diabetes Insulin Treatment Post Discharge order set.  Christena Deem RN, MSN, BC-ADM Inpatient Diabetes Coordinator Team Pager [phone] (8a-5p)

## 2023-12-25 NOTE — Progress Notes (Signed)
PROGRESS NOTE    Edward Roman  BJY:[id number] DOB: Oct 31, 1981 DOA: 12/24/2023 PCP: Elmer Picker [facility]  Chief Complaint  Patient presents with   Hyperglycemia    Hospital Course:  Edward Roman is 43 y.o. male with diabetes, hypertension, presented to the ED complaining of cough, congestion, nausea, vomiting.  Patient reports recent high blood sugars with blurry vision.  He endorses adherence to his prescribed glipizide, metformin and Januvia and was meant to start insulin soon with his primary care physician.  He has also recently tested positive for influenza. In the ED he was febrile to 101.2, tachycardic to 124.  He had leukocytosis of 14.4 and blood sugar of 365 with anion gap of 18 and serum bicarb of 19.  Beta hydroxybutyrate elevated at 4.32, ABG with pH of 7.4.  Chest x-ray and UA negative for acute infection.  Insulin drip was started and patient was given 1.6 L fluid bolus.  Patient was able to be titrated off the insulin drip on 2/21 and is now being transitioned to subcu insulin.  Subjective: On evaluation this morning patient reports he is feeling better but still feeling weak.  He is also endorsing nasal congestion.   Objective: Vitals:   12/25/23 0530 12/25/23 0630 12/25/23 0700 12/25/23 0730  BP: 116/86 (!) 137/99 (!) 133/97 134/87  Pulse: 90 (!) 110 (!) 106 95  Resp: 18 13 18 19   Temp:      TempSrc:      SpO2: 95% 96% 95% 94%  Weight:      Height:        Intake/Output Summary (Last 24 hours) at 12/25/2023 0839 Last data filed at 12/24/2023 2107 Gross per 24 hour  Intake 1606 ml  Output --  Net 1606 ml   Filed Weights   12/24/23 1636  Weight: 80.3 kg    Examination: General exam: Appears calm and comfortable, NAD  Respiratory system: No work of breathing, symmetric chest wall expansion Cardiovascular system: S1 & S2 heard, RRR.  Gastrointestinal system: Abdomen is nondistended, soft and nontender.  Neuro: Alert and oriented. No  focal neurological deficits. Extremities: Symmetric, expected ROM Skin: No rashes, lesions Psychiatry: Demonstrates appropriate judgement and insight. Mood & affect appropriate for situation.   Assessment & Plan:  Principal Problem:   DKA (diabetic ketoacidosis) (HCC) Active Problems:   HTN (hypertension)   SIRS (systemic inflammatory response syndrome) (HCC)    Diabetic ketoacidosis - In the setting of viral infection - Blood sugar on arrival: 365, anion gap 18.  Normal VBG. - Patient endorses adherence to glipizide, Januvia, metformin.  Was meant to begin insulin but has not done so yet - Hemoglobin A1c: 9.5% - Off insulin drip now, continue transition to subcu insulin, titrate up as tolerated - Continue to replace electrolytes as needed - Every 4 hours BMP - Diabetes coordinator consulted - Will initiate on insulin at discharge, will need close outpatient follow-up for medication titration with PCP  Sepsis - Criteria met on arrival with fever 101, tachycardia 124, leukocytosis 14.4. - Recently positive for influenza which likely explains the symptoms - UA and chest x-ray not indicative of bacterial infection - Hold off on further antibiotics for now given viral illness - Cultures taken and pending  Influenza A - +2/20 - Continue supportive treatment  Hypertension - Stable  DVT prophylaxis: lovenox   Code Status: Full Code Family Communication:  Discussed directly with patient Disposition:  Inpatient still hospitalized for insulin titration, will discharge to home when  stable.  Consultants:    Procedures:  N/a  Antimicrobials:  Anti-infectives (From admission, onward)    None       Data Reviewed: I have personally reviewed following labs and imaging studies CBC: Recent Labs  Lab 12/24/23 1706  WBC 14.4*  NEUTROABS 10.7*  HGB 11.3*  HCT 33.5*  MCV 87.2  PLT 504*   Basic Metabolic Panel: Recent Labs  Lab 12/24/23 1706 12/24/23 2215  12/25/23 0132 12/25/23 0627  NA 132* 131* 136 136  K 4.9 3.7 4.1 3.5  CL 95* 97* 99 100  CO2 19* 19* 26 27  GLUCOSE 365* 367* 147* 138*  BUN 16 16 14 12   CREATININE 1.09 1.03 0.86 0.80  CALCIUM 9.6 9.1 9.4 9.3   GFR: Estimated Creatinine Clearance: 123.4 mL/min (by C-G formula based on SCr of 0.8 mg/dL). Liver Function Tests: Recent Labs  Lab 12/24/23 1706  AST 16  ALT 21  ALKPHOS 106  BILITOT 2.0*  PROT 8.9*  ALBUMIN 3.4*   CBG: Recent Labs  Lab 12/25/23 0141 12/25/23 0249 12/25/23 0449 12/25/23 0628 12/25/23 0750  GLUCAP 143* 139* 166* 144* 184*    Recent Results (from the past 240 hours)  Resp panel by RT-PCR (RSV, Flu A&B, Covid) Anterior Nasal Swab     Status: Abnormal   Collection Time: 12/24/23 11:02 PM   Specimen: Anterior Nasal Swab  Result Value Ref Range Status   SARS Coronavirus 2 by RT PCR NEGATIVE NEGATIVE Final    Comment: (NOTE) SARS-CoV-2 target nucleic acids are NOT DETECTED.  The SARS-CoV-2 RNA is generally detectable in upper respiratory specimens during the acute phase of infection. The lowest concentration of SARS-CoV-2 viral copies this assay can detect is 138 copies/mL. A negative result does not preclude SARS-Cov-2 infection and should not be used as the sole basis for treatment or other patient management decisions. A negative result may occur with  improper specimen collection/handling, submission of specimen other than nasopharyngeal swab, presence of viral mutation(s) within the areas targeted by this assay, and inadequate number of viral copies(<138 copies/mL). A negative result must be combined with clinical observations, patient history, and epidemiological information. The expected result is Negative.  Fact Sheet for Patients:  BloggerCourse.com  Fact Sheet for Healthcare Providers:  SeriousBroker.it  This test is no t yet approved or cleared by the [place] FDA and   has been authorized for detection and/or diagnosis of SARS-CoV-2 by FDA under an Emergency Use Authorization (EUA). This EUA will remain  in effect (meaning this test can be used) for the duration of the COVID-19 declaration under Section 564(b)(1) of the Act, 21 U.S.C.section 360bbb-3(b)(1), unless the authorization is terminated  or revoked sooner.       Influenza A by PCR POSITIVE (A) NEGATIVE Final   Influenza B by PCR NEGATIVE NEGATIVE Final    Comment: (NOTE) The Xpert Xpress SARS-CoV-2/FLU/RSV plus assay is intended as an aid in the diagnosis of influenza from Nasopharyngeal swab specimens and should not be used as a sole basis for treatment. Nasal washings and aspirates are unacceptable for Xpert Xpress SARS-CoV-2/FLU/RSV testing.  Fact Sheet for Patients: BloggerCourse.com  Fact Sheet for Healthcare Providers: SeriousBroker.it  This test is not yet approved or cleared by the [place] FDA and has been authorized for detection and/or diagnosis of SARS-CoV-2 by FDA under an Emergency Use Authorization (EUA). This EUA will remain in effect (meaning this test can be used) for the duration of the COVID-19 declaration under Section 564(b)(1)  of the Act, 21 U.S.C. section 360bbb-3(b)(1), unless the authorization is terminated or revoked.     Resp Syncytial Virus by PCR NEGATIVE NEGATIVE Final    Comment: (NOTE) Fact Sheet for Patients: BloggerCourse.com  Fact Sheet for Healthcare Providers: SeriousBroker.it  This test is not yet approved or cleared by the [place] FDA and has been authorized for detection and/or diagnosis of SARS-CoV-2 by FDA under an Emergency Use Authorization (EUA). This EUA will remain in effect (meaning this test can be used) for the duration of the COVID-19 declaration under Section 564(b)(1) of the Act, 21 U.S.C. section 360bbb-3(b)(1),  unless the authorization is terminated or revoked.  Performed at [facility], [address], [place], [place] [address]      Radiology Studies: DG Chest 2 View Result Date: 12/24/2023 CLINICAL DATA:  Cough.  Recent flu positive. EXAM: CHEST - 2 VIEW COMPARISON:  08/21/2023 FINDINGS: The cardiomediastinal contours are normal. The lungs are clear. Pulmonary vasculature is normal. No consolidation, pleural effusion, or pneumothorax. No acute osseous abnormalities are seen. IMPRESSION: No active cardiopulmonary disease. Electronically Signed   By: Narda Rutherford M.D.   On: 12/24/2023 17:27    Scheduled Meds:  enoxaparin (LOVENOX) injection  40 mg Subcutaneous Q24H   insulin aspart  0-5 Units Subcutaneous QHS   insulin aspart  0-9 Units Subcutaneous TID WC   insulin aspart  3 Units Subcutaneous TID WC   Continuous Infusions:  dextrose 5% lactated ringers 125 mL/hr at 12/24/23 2346   insulin 4.8 Units/hr (12/25/23 0754)   lactated ringers Stopped (12/24/23 2347)     LOS: 1 day    Total time spent coordinating care:   Debarah Crape, DO [facility]  To contact the attending physician between 7A-7P please use Epic Chat. To contact the covering physician during after hours 7P-7A, please review Amion.   12/25/2023, 8:39 AM   *This document has been created with the assistance of dictation software. Please excuse typographical errors. *

## 2023-12-25 NOTE — Inpatient Diabetes Management (Addendum)
Inpatient Diabetes Program Recommendations  AACE/ADA: New Consensus Statement on Inpatient Glycemic Control (2015)  Target Ranges:  Prepandial:   less than 140 mg/dL      Peak postprandial:   less than 180 mg/dL (1-2 hours)      Critically ill patients:  140 - 180 mg/dL   Lab Results  Component Value Date   GLUCAP 184 (H) 12/25/2023   HGBA1C 9.5 (H) 12/24/2023    Review of Glycemic Control  Diabetes history: DM 2 Outpatient Diabetes medications: Glipizide 10 Daily, Januvia 100 mg Daily, metformin 1000 mg bid (pt having GI side effects, prefers not to continue medication) Current orders for Inpatient glycemic control:  IV insulin transitioning to SQ Semglee 15 units Daily Novolog 0-9 units tid + hs Novolog 3 units tid meal coverage  C-peptide- 2.6 in  2022 A1c 9.5% on 2/20 Flu+ 2/20  Inpatient Diabetes Program   Spoke with pt over the phone. Pt verified he was taking 3 oral medications at home. Mostly consistent with medication but needs a glucometer to check his glucose trends. He said he had never been on insulin in the past. I walked him through the operation of the insulin pen and have sent him a video via text and attached a QR code with the video instruction on how to administer insulin with the insulin pen. I am working with pharmacy in benefits check regarding basal insulin for home. Will write recommendations when that information is in. Discussed glucose and A1c goals. Encouraged follow up.  In further review we are unable to complete benefits check. We will need to air on the side of caution and prescribe the [employer] Relion 70/30 insulin bid before meals, insulin pen.   Thanks,  Christena Deem RN, MSN, BC-ADM Inpatient Diabetes Coordinator Team Pager [phone] (8a-5p)

## 2023-12-25 NOTE — ED Notes (Addendum)
Per endotool - request transition orders.  Hospitalist messaged.

## 2023-12-25 NOTE — ED Notes (Signed)
Per MD okay to d/c off endotool and start subcutaneous insulin

## 2023-12-26 DIAGNOSIS — E1110 Type 2 diabetes mellitus with ketoacidosis without coma: Secondary | ICD-10-CM | POA: Diagnosis not present

## 2023-12-26 LAB — COMPREHENSIVE METABOLIC PANEL
ALT: 20 U/L (ref 0–44)
AST: 13 U/L — ABNORMAL LOW (ref 15–41)
Albumin: 2.7 g/dL — ABNORMAL LOW (ref 3.5–5.0)
Alkaline Phosphatase: 83 U/L (ref 38–126)
Anion gap: 8 (ref 5–15)
BUN: 8 mg/dL (ref 6–20)
CO2: 26 mmol/L (ref 22–32)
Calcium: 8.8 mg/dL — ABNORMAL LOW (ref 8.9–10.3)
Chloride: 101 mmol/L (ref 98–111)
Creatinine, Ser: 0.79 mg/dL (ref 0.61–1.24)
GFR, Estimated: >60 mL/min (ref >60)
Glucose, Bld: 241 mg/dL — ABNORMAL HIGH (ref 70–99)
Potassium: 3.5 mmol/L (ref 3.5–5.1)
Sodium: 135 mmol/L (ref 135–145)
Total Bilirubin: 0.7 mg/dL (ref 0.0–1.2)
Total Protein: 7.2 g/dL (ref 6.5–8.1)

## 2023-12-26 LAB — CBC WITH DIFFERENTIAL/PLATELET
Abs Immature Granulocytes: 0.1 10*3/uL — ABNORMAL HIGH (ref 0.00–0.07)
Band Neutrophils: 2 %
Basophils Absolute: 0 10*3/uL (ref 0.0–0.1)
Basophils Relative: 0 %
Eosinophils Absolute: 0 10*3/uL (ref 0.0–0.5)
Eosinophils Relative: 0 %
HCT: 27.9 % — ABNORMAL LOW (ref 39.0–52.0)
Hemoglobin: 9.1 g/dL — ABNORMAL LOW (ref 13.0–17.0)
Lymphocytes Relative: 25 %
Lymphs Abs: 2.5 10*3/uL (ref 0.7–4.0)
MCH: 28.8 pg (ref 26.0–34.0)
MCHC: 32.6 g/dL (ref 30.0–36.0)
MCV: 88.3 fL (ref 80.0–100.0)
Metamyelocytes Relative: 1 %
Monocytes Absolute: 0.7 10*3/uL (ref 0.1–1.0)
Monocytes Relative: 7 %
Neutro Abs: 6.8 10*3/uL (ref 1.7–7.7)
Neutrophils Relative %: 65 %
Platelets: 447 10*3/uL — ABNORMAL HIGH (ref 150–400)
RBC: 3.16 MIL/uL — ABNORMAL LOW (ref 4.22–5.81)
RDW: 11.9 % (ref 11.5–15.5)
WBC: 10.1 10*3/uL (ref 4.0–10.5)
nRBC: 0 % (ref 0.0–0.2)

## 2023-12-26 LAB — GLUCOSE, CAPILLARY
Glucose-Capillary: 236 mg/dL — ABNORMAL HIGH (ref 70–99)
Glucose-Capillary: 211 mg/dL — ABNORMAL HIGH (ref 70–99)
Glucose-Capillary: 184 mg/dL — ABNORMAL HIGH (ref 70–99)
Glucose-Capillary: 239 mg/dL — ABNORMAL HIGH (ref 70–99)

## 2023-12-26 LAB — MAGNESIUM: Magnesium: 1.9 mg/dL (ref 1.7–2.4)

## 2023-12-26 LAB — PHOSPHORUS: Phosphorus: 3.5 mg/dL (ref 2.5–4.6)

## 2023-12-26 MED ORDER — DIPHENHYDRAMINE HCL 25 MG PO CAPS
25.0000 mg | ORAL_CAPSULE | Freq: Once | ORAL | Status: AC
  Administered 2023-12-26: 25 mg via ORAL
  Filled 2023-12-26: qty 1

## 2023-12-26 MED ORDER — METFORMIN HCL 500 MG PO TABS
1000.0000 mg | ORAL_TABLET | Freq: Two times a day (BID) | ORAL | Status: DC
  Filled 2023-12-26: qty 2

## 2023-12-26 MED ORDER — LINAGLIPTIN 5 MG PO TABS
5.0000 mg | ORAL_TABLET | Freq: Every day | ORAL | Status: DC
  Administered 2023-12-26 – 2023-12-27 (×2): 5 mg via ORAL
  Filled 2023-12-26 (×2): qty 1

## 2023-12-26 MED ORDER — INSULIN GLARGINE-YFGN 100 UNIT/ML ~~LOC~~ SOLN
25.0000 [IU] | Freq: Every day | SUBCUTANEOUS | Status: DC
  Administered 2023-12-26 – 2023-12-27 (×2): 25 [IU] via SUBCUTANEOUS
  Filled 2023-12-26 (×3): qty 0.25

## 2023-12-26 MED ORDER — POLYVINYL ALCOHOL 1.4 % OP SOLN
1.0000 [drp] | OPHTHALMIC | Status: DC | PRN
  Administered 2023-12-26: 1 [drp] via OPHTHALMIC
  Filled 2023-12-26: qty 15

## 2023-12-26 NOTE — Plan of Care (Signed)
  Problem: Education: Goal: Ability to describe self-care measures that may prevent or decrease complications (Diabetes Survival Skills Education) will improve Outcome: Progressing   Problem: Fluid Volume: Goal: Ability to maintain a balanced intake and output will improve Outcome: Progressing   Problem: Health Behavior/Discharge Planning: Goal: Ability to identify and utilize available resources and services will improve Outcome: Progressing   Problem: Health Behavior/Discharge Planning: Goal: Ability to identify and utilize available resources and services will improve Outcome: Progressing

## 2023-12-26 NOTE — Plan of Care (Signed)

## 2023-12-26 NOTE — Progress Notes (Signed)
 PROGRESS NOTE    Edward Roman  WUJ:[id number] DOB: October 04, 1981 DOA: 12/24/2023 PCP: Elmer Picker [facility]  Chief Complaint  Patient presents with   Hyperglycemia    Hospital Course:  BRENNDEN MASTEN is 43 y.o. male with diabetes, hypertension, presented to the ED complaining of cough, congestion, nausea, vomiting.  Patient reports recent high blood sugars with blurry vision.  He endorses adherence to his prescribed glipizide, metformin and Januvia and was meant to start insulin soon with his primary care physician.  He has also recently tested positive for influenza. In the ED he was febrile to 101.2, tachycardic to 124.  He had leukocytosis of 14.4 and blood sugar of 365 with anion gap of 18 and serum bicarb of 19.  Beta hydroxybutyrate elevated at 4.32, ABG with pH of 7.4.  Chest x-ray and UA negative for acute infection.  Insulin drip was started and patient was given 1.6 L fluid bolus.  Patient was able to be titrated off the insulin drip on 2/21 and is now being transitioned to subcu insulin.  Subjective: No acute events overnight. On evaluation today patient complains of continued headache and congestion.    Objective: Vitals:   12/25/23 1529 12/25/23 1858 12/25/23 2201 12/26/23 0459  BP:  (!) 147/95 (!) 136/95 (!) 140/91  Pulse:  (!) 108 (!) 101 (!) 105  Resp: 17 20  20   Temp:  (!) 100.4 F (38 C) 99.5 F (37.5 C) 97.6 F (36.4 C)  TempSrc:  Oral Oral Oral  SpO2:  96% 94% 98%  Weight:  77.6 kg    Height:  5\' 7"  (1.702 m)      Intake/Output Summary (Last 24 hours) at 12/26/2023 0803 Last data filed at 12/25/2023 2202 Gross per 24 hour  Intake 2195.72 ml  Output 800 ml  Net 1395.72 ml   Filed Weights   12/24/23 1636 12/25/23 1858  Weight: 80.3 kg 77.6 kg    Examination: General exam: Appears calm and comfortable, NAD  Respiratory system: No work of breathing, symmetric chest wall expansion Cardiovascular system: S1 & S2 heard, RRR.   Gastrointestinal system: Abdomen is nondistended, soft and nontender.  Neuro: Alert and oriented. No focal neurological deficits. Extremities: Symmetric, expected ROM Skin: No rashes, lesions Psychiatry: Demonstrates appropriate judgement and insight. Mood & affect appropriate for situation.   Assessment & Plan:  Principal Problem:   DKA (diabetic ketoacidosis) (HCC) Active Problems:   HTN (hypertension)   SIRS (systemic inflammatory response syndrome) (HCC)    Diabetic ketoacidosis - In the setting of viral infection - Blood sugar on arrival: 365, anion gap 18.  Normal VBG. - Patient endorses adherence to glipizide, Januvia, metformin.  Was meant to begin insulin but has not done so yet - Hemoglobin A1c: 9.5% - Off insulin drip now, titrating insulin.  Blood glucoses still consistently above 200 - Continue to replace electrolytes as needed - Diabetes coordinator consulted - Will initiate on insulin at discharge, will need close outpatient follow-up for medication titration with PCP  Sepsis - Criteria met on arrival with fever 101, tachycardia 124, leukocytosis 14.4. - Recently positive for influenza which likely explains the symptoms - UA and chest x-ray not indicative of bacterial infection - Hold off on further antibiotics for now given viral illness - Cultures taken and pending  Influenza A - +2/20 - Continue supportive treatment  Hypertension - Stable  DVT prophylaxis: lovenox   Code Status: Full Code Family Communication:  Discussed directly with patient Disposition:  Inpatient  still hospitalized for insulin titration, will discharge to home when stable. Hopefully within next 48 hours  Consultants:    Procedures:  N/a  Antimicrobials:  Anti-infectives (From admission, onward)    None       Data Reviewed: I have personally reviewed following labs and imaging studies CBC: Recent Labs  Lab 12/24/23 1706 12/25/23 1004 12/26/23 0351  WBC 14.4* 11.3*  10.1  NEUTROABS 10.7*  --  6.8  HGB 11.3* 9.9* 9.1*  HCT 33.5* 28.1* 27.9*  MCV 87.2 85.7 88.3  PLT 504* 500* 447*   Basic Metabolic Panel: Recent Labs  Lab 12/25/23 0132 12/25/23 0627 12/25/23 1004 12/25/23 1340 12/26/23 0351  NA 136 136 135 134* 135  K 4.1 3.5 3.5 3.8 3.5  CL 99 100 99 97* 101  CO2 26 27 24 25 26   GLUCOSE 147* 138* 271* 323* 241*  BUN 14 12 12 12 8   CREATININE 0.86 0.80 0.78 0.84 0.79  CALCIUM 9.4 9.3 9.1 9.1 8.8*  MG  --   --   --   --  1.9  PHOS  --   --   --   --  3.5   GFR: Estimated Creatinine Clearance: 113.6 mL/min (by C-G formula based on SCr of 0.79 mg/dL). Liver Function Tests: Recent Labs  Lab 12/24/23 1706 12/26/23 0351  AST 16 13*  ALT 21 20  ALKPHOS 106 83  BILITOT 2.0* 0.7  PROT 8.9* 7.2  ALBUMIN 3.4* 2.7*   CBG: Recent Labs  Lab 12/25/23 0750 12/25/23 1313 12/25/23 1728 12/25/23 2157 12/26/23 0729  GLUCAP 184* 301* 278* 278* 236*    Recent Results (from the past 240 hours)  Resp panel by RT-PCR (RSV, Flu A&B, Covid) Anterior Nasal Swab     Status: Abnormal   Collection Time: 12/24/23 11:02 PM   Specimen: Anterior Nasal Swab  Result Value Ref Range Status   SARS Coronavirus 2 by RT PCR NEGATIVE NEGATIVE Final    Comment: (NOTE) SARS-CoV-2 target nucleic acids are NOT DETECTED.  The SARS-CoV-2 RNA is generally detectable in upper respiratory specimens during the acute phase of infection. The lowest concentration of SARS-CoV-2 viral copies this assay can detect is 138 copies/mL. A negative result does not preclude SARS-Cov-2 infection and should not be used as the sole basis for treatment or other patient management decisions. A negative result may occur with  improper specimen collection/handling, submission of specimen other than nasopharyngeal swab, presence of viral mutation(s) within the areas targeted by this assay, and inadequate number of viral copies(<138 copies/mL). A negative result must be combined  with clinical observations, patient history, and epidemiological information. The expected result is Negative.  Fact Sheet for Patients:  BloggerCourse.com  Fact Sheet for Healthcare Providers:  SeriousBroker.it  This test is no t yet approved or cleared by the [place] FDA and  has been authorized for detection and/or diagnosis of SARS-CoV-2 by FDA under an Emergency Use Authorization (EUA). This EUA will remain  in effect (meaning this test can be used) for the duration of the COVID-19 declaration under Section 564(b)(1) of the Act, 21 U.S.C.section 360bbb-3(b)(1), unless the authorization is terminated  or revoked sooner.       Influenza A by PCR POSITIVE (A) NEGATIVE Final   Influenza B by PCR NEGATIVE NEGATIVE Final    Comment: (NOTE) The Xpert Xpress SARS-CoV-2/FLU/RSV plus assay is intended as an aid in the diagnosis of influenza from Nasopharyngeal swab specimens and should not be used as a sole  basis for treatment. Nasal washings and aspirates are unacceptable for Xpert Xpress SARS-CoV-2/FLU/RSV testing.  Fact Sheet for Patients: BloggerCourse.com  Fact Sheet for Healthcare Providers: SeriousBroker.it  This test is not yet approved or cleared by the [place] FDA and has been authorized for detection and/or diagnosis of SARS-CoV-2 by FDA under an Emergency Use Authorization (EUA). This EUA will remain in effect (meaning this test can be used) for the duration of the COVID-19 declaration under Section 564(b)(1) of the Act, 21 U.S.C. section 360bbb-3(b)(1), unless the authorization is terminated or revoked.     Resp Syncytial Virus by PCR NEGATIVE NEGATIVE Final    Comment: (NOTE) Fact Sheet for Patients: BloggerCourse.com  Fact Sheet for Healthcare Providers: SeriousBroker.it  This test is not yet  approved or cleared by the [place] FDA and has been authorized for detection and/or diagnosis of SARS-CoV-2 by FDA under an Emergency Use Authorization (EUA). This EUA will remain in effect (meaning this test can be used) for the duration of the COVID-19 declaration under Section 564(b)(1) of the Act, 21 U.S.C. section 360bbb-3(b)(1), unless the authorization is terminated or revoked.  Performed at [facility], [address] [place], [place] [address]      Radiology Studies: DG Chest 2 View Result Date: 12/24/2023 CLINICAL DATA:  Cough.  Recent flu positive. EXAM: CHEST - 2 VIEW COMPARISON:  08/21/2023 FINDINGS: The cardiomediastinal contours are normal. The lungs are clear. Pulmonary vasculature is normal. No consolidation, pleural effusion, or pneumothorax. No acute osseous abnormalities are seen. IMPRESSION: No active cardiopulmonary disease. Electronically Signed   By: Narda Rutherford M.D.   On: 12/24/2023 17:27    Scheduled Meds:  enoxaparin (LOVENOX) injection  40 mg Subcutaneous Q24H   fluticasone  1 spray Each Nare Daily   insulin aspart  0-5 Units Subcutaneous QHS   insulin aspart  0-9 Units Subcutaneous TID WC   insulin aspart  3 Units Subcutaneous TID WC   insulin glargine-yfgn  20 Units Subcutaneous Daily   Continuous Infusions:     LOS: 1 day    Total time spent coordinating care:   Debarah Crape, DO [facility]  To contact the attending physician between 7A-7P please use Epic Chat. To contact the covering physician during after hours 7P-7A, please review Amion.   12/26/2023, 8:03 AM   *This document has been created with the assistance of dictation software. Please excuse typographical errors. *

## 2023-12-27 DIAGNOSIS — E1110 Type 2 diabetes mellitus with ketoacidosis without coma: Secondary | ICD-10-CM | POA: Diagnosis not present

## 2023-12-27 LAB — CBC WITH DIFFERENTIAL/PLATELET
Abs Immature Granulocytes: 0.2 10*3/uL — ABNORMAL HIGH (ref 0.00–0.07)
Basophils Absolute: 0 10*3/uL (ref 0.0–0.1)
Basophils Relative: 0 %
Eosinophils Absolute: 0 10*3/uL (ref 0.0–0.5)
Eosinophils Relative: 0 %
HCT: 25.8 % — ABNORMAL LOW (ref 39.0–52.0)
Hemoglobin: 8.8 g/dL — ABNORMAL LOW (ref 13.0–17.0)
Lymphocytes Relative: 17 %
Lymphs Abs: 1.8 10*3/uL (ref 0.7–4.0)
MCH: 29.6 pg (ref 26.0–34.0)
MCHC: 34.1 g/dL (ref 30.0–36.0)
MCV: 86.9 fL (ref 80.0–100.0)
Metamyelocytes Relative: 2 %
Monocytes Absolute: 0.6 10*3/uL (ref 0.1–1.0)
Monocytes Relative: 6 %
Neutro Abs: 8 10*3/uL — ABNORMAL HIGH (ref 1.7–7.7)
Neutrophils Relative %: 75 %
Platelets: 500 10*3/uL — ABNORMAL HIGH (ref 150–400)
RBC: 2.97 MIL/uL — ABNORMAL LOW (ref 4.22–5.81)
RDW: 11.9 % (ref 11.5–15.5)
WBC: 10.7 10*3/uL — ABNORMAL HIGH (ref 4.0–10.5)
nRBC: 0 % (ref 0.0–0.2)

## 2023-12-27 LAB — COMPREHENSIVE METABOLIC PANEL
ALT: 16 U/L (ref 0–44)
AST: 11 U/L — ABNORMAL LOW (ref 15–41)
Albumin: 2.7 g/dL — ABNORMAL LOW (ref 3.5–5.0)
Alkaline Phosphatase: 79 U/L (ref 38–126)
Anion gap: 9 (ref 5–15)
BUN: 7 mg/dL (ref 6–20)
CO2: 28 mmol/L (ref 22–32)
Calcium: 8.9 mg/dL (ref 8.9–10.3)
Chloride: 97 mmol/L — ABNORMAL LOW (ref 98–111)
Creatinine, Ser: 0.87 mg/dL (ref 0.61–1.24)
GFR, Estimated: >60 mL/min (ref >60)
Glucose, Bld: 180 mg/dL — ABNORMAL HIGH (ref 70–99)
Potassium: 3.5 mmol/L (ref 3.5–5.1)
Sodium: 134 mmol/L — ABNORMAL LOW (ref 135–145)
Total Bilirubin: 0.7 mg/dL (ref 0.0–1.2)
Total Protein: 7.3 g/dL (ref 6.5–8.1)

## 2023-12-27 LAB — PHOSPHORUS: Phosphorus: 4.4 mg/dL (ref 2.5–4.6)

## 2023-12-27 LAB — GLUCOSE, CAPILLARY
Glucose-Capillary: 174 mg/dL — ABNORMAL HIGH (ref 70–99)
Glucose-Capillary: 238 mg/dL — ABNORMAL HIGH (ref 70–99)

## 2023-12-27 LAB — MAGNESIUM: Magnesium: 1.7 mg/dL (ref 1.7–2.4)

## 2023-12-27 MED ORDER — BASAGLAR KWIKPEN 100 UNIT/ML ~~LOC~~ SOPN: 15.0000 [IU] | PEN_INJECTOR | Freq: Every day | SUBCUTANEOUS | 0 refills | Status: AC

## 2023-12-27 MED ORDER — PEN NEEDLES 31G X 5 MM MISC: 1.0000 | Freq: Three times a day (TID) | 0 refills | Status: AC

## 2023-12-27 MED ORDER — GLUCAGON EMERGENCY 1 MG IJ KIT: 1.0000 mg | PACK | INTRAMUSCULAR | 0 refills | Status: AC | PRN

## 2023-12-27 MED ORDER — BLOOD GLUCOSE MONITORING SUPPL DEVI: 1.0000 | Freq: Three times a day (TID) | 0 refills | Status: AC

## 2023-12-27 MED ORDER — LANCETS MISC: 1.0000 | Freq: Three times a day (TID) | 0 refills | Status: AC

## 2023-12-27 MED ORDER — BLOOD GLUCOSE TEST VI STRP: 1.0000 | ORAL_STRIP | Freq: Three times a day (TID) | 0 refills | Status: AC

## 2023-12-27 MED ORDER — JANUVIA 100 MG PO TABS: 100.0000 mg | ORAL_TABLET | Freq: Every day | ORAL | 0 refills | Status: AC

## 2023-12-27 MED ORDER — LANCET DEVICE MISC: 1.0000 | Freq: Three times a day (TID) | 0 refills | Status: AC

## 2023-12-27 NOTE — Plan of Care (Signed)

## 2023-12-27 NOTE — Hospital Course (Signed)
 Edward Roman is 43 y.o. male with diabetes, hypertension, presented to the ED complaining of cough, congestion, nausea, vomiting.  Patient reports recent high blood sugars with blurry vision.  He endorses adherence to his prescribed glipizide, metformin and Januvia and was meant to start insulin soon with his primary care physician.  He has also recently tested positive for influenza. In the ED he was febrile to 101.2, tachycardic to 124.  He had leukocytosis of 14.4 and blood sugar of 365 with anion gap of 18 and serum bicarb of 19.  Beta hydroxybutyrate elevated at 4.32, ABG with pH of 7.4.  Chest x-ray and UA negative for acute infection.  He was initially admitted to the ICU for DKA management.  Patient was able to be titrated off the insulin drip on 2/21.  After further insulin titration on 2/22 glucose gradually resolved and was consistently below 200.  We had extensive discussion regarding insulin management outpatient.  He met with diabetes coordinator.  On 2/23 patient was stable for discharge home.  He will need close outpatient follow-up with primary care for further diabetes management.   Diabetic ketoacidosis - In the setting of viral infection - Blood sugar on arrival: 365, anion gap 18.  Normal VBG. - Patient endorses adherence to glipizide, Januvia, metformin.  Was meant to begin insulin but has not done so yet - Hemoglobin A1c: 9.5% - Titrating insulin. Now blood glucoses <200 - Continue to replace electrolytes as needed - Diabetes coordinator consulted - Close outpatient follow-up for medication titration with PCP   Sepsis - Criteria met on arrival with fever 101, tachycardia 124, leukocytosis 14.4. - Recently positive for influenza which likely explains the symptoms - UA and chest x-ray not indicative of bacterial infection - Hold off on further antibiotics for now given viral illness - Cultures taken and pending   Influenza A - +2/20 - Continue supportive treatment    Hypertension - Stable

## 2023-12-27 NOTE — Discharge Summary (Signed)
 Physician Discharge Summary   Patient: Edward Roman MRN: [id number] DOB: 02-May-1981  Admit date:     12/24/2023  Discharge date: 12/27/23  Discharge Physician: Debarah Crape   PCP: [facility]   Recommendations at discharge:    Follow up with PCP for DM management  Discharge Diagnoses: Principal Problem:   DKA (diabetic ketoacidosis) (HCC) Active Problems:   HTN (hypertension)   SIRS (systemic inflammatory response syndrome) (HCC)  Resolved Problems:   * No resolved hospital problems. *  Hospital Course: Edward Roman is 43 y.o. male with diabetes, hypertension, presented to the ED complaining of cough, congestion, nausea, vomiting.  Patient reports recent high blood sugars with blurry vision.  He endorses adherence to his prescribed glipizide, metformin and Januvia and was meant to start insulin soon with his primary care physician.  He has also recently tested positive for influenza. In the ED he was febrile to 101.2, tachycardic to 124.  He had leukocytosis of 14.4 and blood sugar of 365 with anion gap of 18 and serum bicarb of 19.  Beta hydroxybutyrate elevated at 4.32, ABG with pH of 7.4.  Chest x-ray and UA negative for acute infection.  He was initially admitted to the ICU for DKA management.  Patient was able to be titrated off the insulin drip on 2/21.  After further insulin titration on 2/22 glucose gradually resolved and was consistently below 200.  We had extensive discussion regarding insulin management outpatient.  He met with diabetes coordinator.  On 2/23 patient was stable for discharge home.  He will need close outpatient follow-up with primary care for further diabetes management.   Diabetic ketoacidosis - In the setting of viral infection - Blood sugar on arrival: 365, anion gap 18.  Normal VBG. - Patient endorses adherence to glipizide, Januvia, metformin.  Was meant to begin insulin but has not done so yet - Hemoglobin A1c:  9.5% - Titrating insulin. Now blood glucoses <200 - Continue to replace electrolytes as needed - Diabetes coordinator consulted - Close outpatient follow-up for medication titration with PCP   Sepsis - Criteria met on arrival with fever 101, tachycardia 124, leukocytosis 14.4. - Recently positive for influenza which likely explains the symptoms - UA and chest x-ray not indicative of bacterial infection - Hold off on further antibiotics for now given viral illness - Cultures taken and pending   Influenza A - +2/20 - Continue supportive treatment   Hypertension - Stable   Consultants: n/a Procedures performed: n/a  Disposition: Home Diet recommendation:  Discharge Diet Orders (From admission, onward)     Start     Ordered   12/27/23 0000  Diet general        12/27/23 1128           Carb modified diet DISCHARGE MEDICATION: Allergies as of 12/27/2023       Reactions   Penicillin G Hives        Medication List     TAKE these medications    Basaglar KwikPen 100 UNIT/ML Inject 15 Units into the skin daily. May substitute as needed per insurance.   Blood Glucose Monitoring Suppl Devi 1 each by Does not apply route 3 (three) times daily. May dispense any manufacturer covered by patient's insurance.   BLOOD GLUCOSE TEST STRIPS Strp 1 each by Does not apply route 3 (three) times daily. Use as directed to check blood sugar. May dispense any manufacturer covered by patient's insurance and fits patient's device.  Glucagon Emergency 1 MG Kit Inject 1 mg into the skin as needed for up to 2 doses (Severe low blood sugar).   Januvia 100 MG tablet Generic drug: sitaGLIPtin Take 1 tablet (100 mg total) by mouth daily. What changed: how much to take   Lancet Device Misc 1 each by Does not apply route 3 (three) times daily. May dispense any manufacturer covered by patient's insurance.   Lancets Misc 1 each by Does not apply route 3 (three) times daily. Use as  directed to check blood sugar. May dispense any manufacturer covered by patient's insurance and fits patient's device.   metFORMIN 1000 MG tablet Commonly known as: GLUCOPHAGE Take 1,000 mg by mouth 2 (two) times daily with a meal.   Pen Needles 31G X 5 MM Misc 1 each by Does not apply route 3 (three) times daily. May dispense any manufacturer covered by patient's insurance.        Discharge Exam: Filed Weights   12/24/23 1636 12/25/23 1858  Weight: 80.3 kg 77.6 kg   Constitutional:  Normal appearance. Non toxic-appearing.  HENT: Head Normocephalic and atraumatic.  Mucous membranes are moist.  Eyes:  Extraocular intact. Conjunctivae normal. Pupils are equal, round, and reactive to light.  Cardiovascular: Rate and Rhythm: Normal rate and regular rhythm.  Pulmonary: Non labored, symmetric rise of chest wall.  Musculoskeletal:  Normal range of motion.  Skin: warm and dry. not jaundiced.  Neurological: No focal deficit present. alert. Oriented. Psychiatric: Mood and Affect congruent.   Condition at discharge: stable  The results of significant diagnostics from this hospitalization (including imaging, microbiology, ancillary and laboratory) are listed below for reference.   Imaging Studies: DG Chest 2 View Result Date: 12/24/2023 CLINICAL DATA:  Cough.  Recent flu positive. EXAM: CHEST - 2 VIEW COMPARISON:  08/21/2023 FINDINGS: The cardiomediastinal contours are normal. The lungs are clear. Pulmonary vasculature is normal. No consolidation, pleural effusion, or pneumothorax. No acute osseous abnormalities are seen. IMPRESSION: No active cardiopulmonary disease. Electronically Signed   By: Narda Rutherford M.D.   On: 12/24/2023 17:27    Microbiology: Results for orders placed or performed during the hospital encounter of 12/24/23  Resp panel by RT-PCR (RSV, Flu A&B, Covid) Anterior Nasal Swab     Status: Abnormal   Collection Time: 12/24/23 11:02 PM   Specimen: Anterior Nasal Swab   Result Value Ref Range Status   SARS Coronavirus 2 by RT PCR NEGATIVE NEGATIVE Final    Comment: (NOTE) SARS-CoV-2 target nucleic acids are NOT DETECTED.  The SARS-CoV-2 RNA is generally detectable in upper respiratory specimens during the acute phase of infection. The lowest concentration of SARS-CoV-2 viral copies this assay can detect is 138 copies/mL. A negative result does not preclude SARS-Cov-2 infection and should not be used as the sole basis for treatment or other patient management decisions. A negative result may occur with  improper specimen collection/handling, submission of specimen other than nasopharyngeal swab, presence of viral mutation(s) within the areas targeted by this assay, and inadequate number of viral copies(<138 copies/mL). A negative result must be combined with clinical observations, patient history, and epidemiological information. The expected result is Negative.  Fact Sheet for Patients:  BloggerCourse.com  Fact Sheet for Healthcare Providers:  SeriousBroker.it  This test is no t yet approved or cleared by the [place] FDA and  has been authorized for detection and/or diagnosis of SARS-CoV-2 by FDA under an Emergency Use Authorization (EUA). This EUA will remain  in effect (meaning this  test can be used) for the duration of the COVID-19 declaration under Section 564(b)(1) of the Act, 21 U.S.C.section 360bbb-3(b)(1), unless the authorization is terminated  or revoked sooner.       Influenza A by PCR POSITIVE (A) NEGATIVE Final   Influenza B by PCR NEGATIVE NEGATIVE Final    Comment: (NOTE) The Xpert Xpress SARS-CoV-2/FLU/RSV plus assay is intended as an aid in the diagnosis of influenza from Nasopharyngeal swab specimens and should not be used as a sole basis for treatment. Nasal washings and aspirates are unacceptable for Xpert Xpress SARS-CoV-2/FLU/RSV testing.  Fact Sheet for  Patients: BloggerCourse.com  Fact Sheet for Healthcare Providers: SeriousBroker.it  This test is not yet approved or cleared by the [place] FDA and has been authorized for detection and/or diagnosis of SARS-CoV-2 by FDA under an Emergency Use Authorization (EUA). This EUA will remain in effect (meaning this test can be used) for the duration of the COVID-19 declaration under Section 564(b)(1) of the Act, 21 U.S.C. section 360bbb-3(b)(1), unless the authorization is terminated or revoked.     Resp Syncytial Virus by PCR NEGATIVE NEGATIVE Final    Comment: (NOTE) Fact Sheet for Patients: BloggerCourse.com  Fact Sheet for Healthcare Providers: SeriousBroker.it  This test is not yet approved or cleared by the [place] FDA and has been authorized for detection and/or diagnosis of SARS-CoV-2 by FDA under an Emergency Use Authorization (EUA). This EUA will remain in effect (meaning this test can be used) for the duration of the COVID-19 declaration under Section 564(b)(1) of the Act, 21 U.S.C. section 360bbb-3(b)(1), unless the authorization is terminated or revoked.  Performed at [facility], [address], [place], [place] [address]     Labs: CBC: Recent Labs  Lab 12/24/23 1706 12/25/23 1004 12/26/23 0351 12/27/23 0518  WBC 14.4* 11.3* 10.1 10.7*  NEUTROABS 10.7*  --  6.8 8.0*  HGB 11.3* 9.9* 9.1* 8.8*  HCT 33.5* 28.1* 27.9* 25.8*  MCV 87.2 85.7 88.3 86.9  PLT 504* 500* 447* 500*   Basic Metabolic Panel: Recent Labs  Lab 12/25/23 0627 12/25/23 1004 12/25/23 1340 12/26/23 0351 12/27/23 0518  NA 136 135 134* 135 134*  K 3.5 3.5 3.8 3.5 3.5  CL 100 99 97* 101 97*  CO2 27 24 25 26 28   GLUCOSE 138* 271* 323* 241* 180*  BUN 12 12 12 8 7   CREATININE 0.80 0.78 0.84 0.79 0.87  CALCIUM 9.3 9.1 9.1 8.8* 8.9  MG  --   --   --  1.9 1.7  PHOS  --   --   --  3.5  4.4   Liver Function Tests: Recent Labs  Lab 12/24/23 1706 12/26/23 0351 12/27/23 0518  AST 16 13* 11*  ALT 21 20 16   ALKPHOS 106 83 79  BILITOT 2.0* 0.7 0.7  PROT 8.9* 7.2 7.3  ALBUMIN 3.4* 2.7* 2.7*   CBG: Recent Labs  Lab 12/26/23 1147 12/26/23 1634 12/26/23 2003 12/27/23 0730 12/27/23 1132  GLUCAP 211* 184* 239* 174* 238*    Discharge time spent: greater than 30 minutes.  Signed: Debarah Crape, DO [facility] 12/27/2023

## 2024-06-14 LAB — GLUCOSE, POCT (MANUAL RESULT ENTRY): POC Glucose: 339 mg/dL — AB (ref 70–99)

## 2024-06-14 NOTE — Congregational Nurse Program (Signed)
 Pt attended [facility] on today requesting assisting with checking his blood sugar, due to concern of it being elevated.   Pt states he needs assistance with getting his insulin  pen needs, insulin  (Basaglar ) and metformin .  He denies being out of BP meds (losartan ) and Glipizide .  State states he has not taken the glipizide  in one year.  States he doesn't like taking a lot of medicine.   In addition during the pt's visit, pt was able to complete his new enrollment (8.12.25) to return back into the [facility] Uninsured Program, being that he has no health insurance any longer with his job.  Pt states he continues to remain at [facility] with his next appt scheduled for 8.25.25 at 9:00am  Chief Reasons Needed for PCP - Continue to maintain care with PCP at [facility] - Medication change from  Basaglar  to more affordable option needed   Past/Current Medical History (self reported) Diabetes, Hypertension  Medication Hx (self reported) -Admits not taking- Glipizide  in over one year -Losartan  Potassium-Intermittent use -Metformin -Consistent  -Basaglar  KwikPen- Consistent but need refill   Socio-determinants health needs identified: Financial Strain to not be able to afford medications Food Insecurity  Completed During today's Visit -Conducted health screenings for diabetes- ABNORMAL (No MEDS TAKEN PRIOR TO VISIT)   _Pt offered to shop onsite food market at [employer] and shopped for his household-4 -Pt provided [employer] to pick up metformin  and insulin  pen needles.  -Pt completed enrollment into the [facility] Uninsured Program on today ( 8.12 25) and pt was approved with eligibility thru 8.12.26 by Ahnycea S.(Care Guide);  Pt also completed a Medicaid application on today and status is  currently pending   Pt Next Scheduled Appointment for  follow up visit with PCP at [facility] for June 27, 2024 at [facility]  Plans   -Continous nurse case management upon completion of first medical appointment and thereafter will be maintained with patient while pt is enrolled into [facility] program

## 2024-06-20 ENCOUNTER — Telehealth: Payer: Self-pay

## 2024-06-20 NOTE — Telephone Encounter (Signed)
 Missed call from client, texted client, he states he is looking for back to school supplies for his kids and clothing.  Made him aware [facility] has some school supplies and backpacks available. No clothing here available. Also sent text messages of two events this week in area for back to school supplies via text message. Will await response.   Avelina JONELLE Skeen RN [name]

## 2024-06-22 NOTE — Congregational Nurse Program (Signed)
  Dept: [phone]   Congregational Nurse Program Note  Date of Encounter: 06/22/2024  Past Medical History: Past Medical History:  Diagnosis Date   Diabetes mellitus without complication (HCC)    Hypertension     Encounter Details:  Community Questionnaire - 06/22/24 1424       Questionnaire   Medical Provider Yes    Medical Referrals Made N/A    ED Visit Averted N/A    Life-Saving Intervention Made N/A         Client contacted as he reports he is unable to afford his insuline Glargine and Losartan  and Glipizide .  Confirmed with [facility] [place] available Glargine = 140$ for uninsured Losartan  9 $ Glipizide  4.50$ for 1 month supply  Care Connect program assisted with a one time assistance to obtain medications. Client has applied for [place] [facility], called this morning and his application has not been reviewed for approval yet. Requested that application please be expedited. Social Worker found application and states they will review it as soon as possible.  Will update Client when I know about [facility]. He was also provided with 1 TruMetrix glucometer, 1 bottle of 50 strips and 1 box of lancets as well as a sharps containter and some alcohol  wipes to check his blood sugar. Client is aware of how to check his sugar, provided him with log sheets to record his blood sugars. He has an appointment with Lynwood Massie Salome Bernardino on 06/27/24, encouraged him to discuss costs of medications with provider and discuss which ones he has stopped taking. Once approved for [facility] he will need to request medications that are available to be sent there.  Medicaid application submitted 06/14/24 awaiting approval or denial.   Avelina JONELLE Skeen RN [name]

## 2024-06-28 ENCOUNTER — Telehealth: Payer: Self-pay

## 2024-06-28 NOTE — Telephone Encounter (Signed)
 Attempted follow up call to [facility] client, no answer, left Voicemail, noted client's 06/27/24 appointment with [facility] has been rescheduled to 07/05/24.  Client has been approved for [facility] until 06/03/25 but for generics only. Client needs 4 consecutive paystubs for full brand and generics approval.   Avelina JONELLE Skeen RN [name]

## 2024-07-13 ENCOUNTER — Telehealth: Payer: Self-pay

## 2024-07-13 NOTE — Telephone Encounter (Signed)
 Attempted return call to client who had requested a return call. First attempt was at 10:24 am and the Second attempt was now, both attempts no answer, left voicemails requesting return calls   Avelina JONELLE Skeen RN [name]

## 2024-07-25 ENCOUNTER — Emergency Department (HOSPITAL_COMMUNITY)
Admission: EM | Admit: 2024-07-25 | Discharge: 2024-07-25 | Disposition: A | Discharge status: Home / Self Care | Attending: Emergency Medicine | Admitting: Emergency Medicine

## 2024-07-25 ENCOUNTER — Encounter (HOSPITAL_COMMUNITY): Payer: Self-pay

## 2024-07-25 ENCOUNTER — Other Ambulatory Visit: Payer: Self-pay

## 2024-07-25 DIAGNOSIS — Z794 Long term (current) use of insulin: Secondary | ICD-10-CM | POA: Insufficient documentation

## 2024-07-25 DIAGNOSIS — R059 Cough, unspecified: Secondary | ICD-10-CM | POA: Diagnosis present

## 2024-07-25 DIAGNOSIS — J0100 Acute maxillary sinusitis, unspecified: Secondary | ICD-10-CM | POA: Insufficient documentation

## 2024-07-25 LAB — SARS CORONAVIRUS 2 BY RT PCR: SARS Coronavirus 2 by RT PCR: NEGATIVE

## 2024-07-25 MED ORDER — DOXYCYCLINE HYCLATE 100 MG PO TABS
100.0000 mg | ORAL_TABLET | Freq: Once | ORAL | Status: AC
  Administered 2024-07-25: 100 mg via ORAL
  Filled 2024-07-25: qty 1

## 2024-07-25 MED ORDER — DOXYCYCLINE HYCLATE 100 MG PO CAPS: 100.0000 mg | ORAL_CAPSULE | Freq: Two times a day (BID) | ORAL | 0 refills | Status: DC

## 2024-07-25 MED ORDER — DOXYCYCLINE HYCLATE 100 MG PO CAPS: 100.0000 mg | ORAL_CAPSULE | Freq: Two times a day (BID) | ORAL | 0 refills | Status: AC

## 2024-07-25 NOTE — ED Triage Notes (Signed)
 Pt presents to ED with complaints of congestion, cough, night sweats, sore throat, sneezing x couple days.

## 2024-07-25 NOTE — ED Notes (Signed)
 Pt states he take BP medication (losartan ) he did take it this morning, seldom missed doses.

## 2024-07-25 NOTE — Discharge Instructions (Addendum)
 Pleasure taking care of you today.  You were evaluated in the ER for a productive cough and sinus congestion and this been going on for about 10 days.  Your symptoms are consistent with a sinus infection.  Your vital signs showed elevated blood pressure but were otherwise reassuring.  I am prescribing you antibiotics.  Come back to the ER for new or worsening symptoms.  Follow-up close with your primary care doctor.

## 2024-07-25 NOTE — ED Provider Notes (Signed)
 [address] EMERGENCY DEPARTMENT AT [facility] Provider Note   CSN: [id number] Arrival date & time: 07/25/24  1021     Patient presents with: Nasal Congestion   Edward Roman is a 43 y.o. male.  He presents ER today for evaluation of sinus pressure and congestion, cough red with yellow to green sputum, sore throat, sneezing that started about 10 days ago, he does going to get better but is continued to worsen.  He has noted some sweats at night as well.  He reports he feels somewhat tight in his chest with coughing and breathing.  Denies any chest pain, back pain, exertional symptoms.   HPI     Prior to Admission medications   Medication Sig Start Date End Date Taking? Authorizing Provider  Blood Glucose Monitoring Suppl DEVI 1 each by Does not apply route 3 (three) times daily. May dispense any manufacturer covered by patient's insurance. 12/27/23   Dezii, Alexandra, DO  Glucagon , rDNA, (GLUCAGON  EMERGENCY) 1 MG KIT Inject 1 mg into the skin as needed for up to 2 doses (Severe low blood sugar). 12/27/23   Dezii, Alexandra, DO  Glucose Blood (BLOOD GLUCOSE TEST STRIPS) STRP 1 each by Does not apply route 3 (three) times daily. Use as directed to check blood sugar. May dispense any manufacturer covered by patient's insurance and fits patient's device. 12/27/23   Dezii, Alexandra, DO  Insulin  Glargine (BASAGLAR  KWIKPEN) 100 UNIT/ML Inject 15 Units into the skin daily. May substitute as needed per insurance. 12/27/23   Dezii, Alexandra, DO  Insulin  Pen Needle (PEN NEEDLES) 31G X 5 MM MISC 1 each by Does not apply route 3 (three) times daily. May dispense any manufacturer covered by patient's insurance. 12/27/23   Dezii, Alexandra, DO  JANUVIA  100 MG tablet Take 1 tablet (100 mg total) by mouth daily. 12/27/23   Dezii, Alexandra, DO  Lancet Device MISC 1 each by Does not apply route 3 (three) times daily. May dispense any manufacturer covered by patient's insurance. 12/27/23   Dezii,  Alexandra, DO  Lancets MISC 1 each by Does not apply route 3 (three) times daily. Use as directed to check blood sugar. May dispense any manufacturer covered by patient's insurance and fits patient's device. 12/27/23   Dezii, Alexandra, DO  metFORMIN  (GLUCOPHAGE ) 1000 MG tablet Take 1,000 mg by mouth 2 (two) times daily with a meal. 12/16/23   [provider]  hydrochlorothiazide  (HYDRODIURIL ) 25 MG tablet Take 1 tablet (25 mg total) by mouth daily. Patient not taking: Reported on 11/24/2019 10/08/18 10/05/20  Armida Culver, PA-C    Allergies: Penicillin g    Review of Systems  Updated Vital Signs BP (!) 155/104 (BP Location: Left Arm)   Pulse 90   Temp 97.6 F (36.4 C)   Resp 19   Ht 5' 6 (1.676 m)   Wt 80.7 kg   SpO2 96%   BMI 28.73 kg/m   Physical Exam Vitals and nursing note reviewed.  Constitutional:      General: He is not in acute distress.    Appearance: He is well-developed.  HENT:     Head: Normocephalic and atraumatic.     Left Ear: Tympanic membrane normal.     Nose: Mucosal edema and congestion present.     Right Sinus: Maxillary sinus tenderness present.     Left Sinus: Maxillary sinus tenderness present.     Mouth/Throat:     Mouth: Mucous membranes are moist.     Pharynx: Uvula  midline. No uvula swelling.     Comments: Moderate amount of postnasal drip noted in posterior pharynx Eyes:     Extraocular Movements: Extraocular movements intact.     Conjunctiva/sclera: Conjunctivae normal.     Pupils: Pupils are equal, round, and reactive to light.  Cardiovascular:     Rate and Rhythm: Normal rate and regular rhythm.     Heart sounds: No murmur heard. Pulmonary:     Effort: Pulmonary effort is normal. No respiratory distress.     Breath sounds: Normal breath sounds.  Abdominal:     Palpations: Abdomen is soft.     Tenderness: There is no abdominal tenderness.  Musculoskeletal:        General: No swelling.     Cervical back: Neck supple.  Skin:     General: Skin is warm and dry.     Capillary Refill: Capillary refill takes less than 2 seconds.  Neurological:     Mental Status: He is alert.  Psychiatric:        Mood and Affect: Mood normal.     (all labs ordered are listed, but only abnormal results are displayed) Labs Reviewed  SARS CORONAVIRUS 2 BY RT PCR    EKG: None  Radiology: No results found.   Procedures   Medications Ordered in the ED - No data to display                                  Medical Decision Making Differential diagnosis includes but not limited to sinusitis, viral syndrome, allergic rhinitis, pneumonia, other  ED course: Patient has had about 10 days of cough, congestion, sore throat that is been getting worse his cough is productive of yellow-green sputum and gets small amounts with this out when he blows his nose as well.  He has subjective fever, exam he has normal vitals except for mild hypertension, clear lung sounds, normal heart sounds, no swelling of extremities.  He is speaking in full and clear sentences and handling oral secretions well he has got tenderness of his bilateral maxillary sinuses and stated postnasal drip noted posterior pharynx I feel patient's symptoms are consistent with sinusitis.  He states he does feel somewhat tight in his chest when taking a deep breath and coughing.  Lung sounds are clear, I offered chest x-ray and after shared decision-making discussion patient would prefer to just go home now will have chest x-ray, as he is being treated with antibiotics that would cover pneumonia regardless.  Risk Prescription drug management.        Final diagnoses:  None    ED Discharge Orders     None          Suellen Sherran DELENA DEVONNA 07/25/24 1320    Bernard Drivers, MD 07/26/24 1540

## 2024-08-01 ENCOUNTER — Telehealth: Payer: Self-pay

## 2024-08-01 NOTE — Telephone Encounter (Addendum)
 Called for follow with [facility] client who was seen last at Primary Care Provider Lyn[facility] Skates on 07/20/24 A1C at that time was 8.1 up from 7.9 we discussed goal of <7.0 encouraged to take medications, keep appointments and follow diabetic friendly diet.    His next appointment is scheduled for 10/20/24.  He had questions about cost of Losartan  and glipizide , reviewed GoodRx costs at [place], suggested client fill on 30 days at a time as Losartan  is 9.00$ and Glipizide  10 mg is 5.46 for 30 days. Also client is eligible for generics only at [facility], suggested he refill those two for 30 days and call his provider and ask for any medications available for [facility] that are generics to be sent to [facility] if they carry them on their formulary. I will also email his approval again to Nat SAUNDERS at [facility] to update his status.  Will continue to follow  Avelina SAUNDERS Skeen RN [name]   Addendum Also noted that client had an ED visit on 07/25/24 and follow up visit the same week for same complaint on 9/24 with his primary care provider.  Diagnosed with Sinus infection per client and was placed on antibiotics, he states he finished those today and is feeling much better. Discussed provider recommendations also of OTC saline and fluticasone  nasal spray, he states he did not get those, but did complete Antibiotics.  No further needs at this time.

## 2024-08-09 NOTE — Congregational Nurse Program (Unsigned)
 Assisted pt with contacting DSS of [facility] to follow up on his current Medicaid approval status.   Pt states he received a denial letter and needed assistance with receiving clarification for reason for denial  Pt and I  contacted and spoke with DSS Intake Leader for Medicaid and she advised patient accordingly that she will need an additional wage verification document to be submitted in order to get his Medicaid application to be  moved forward.  Coordination with [facility] Uninsured Program (Care Guide,  Fay RAMAN) was completed to assist with patient wage verification form  to be faxed over to the patient's employer to be completed so that the patient can forward this requested information back to the DSS of [place]- Medicaid division [employer] attorney)  Pt was grateful of assistance from all parties involved and then left the clinic

## 2024-08-16 ENCOUNTER — Telehealth: Payer: Self-pay

## 2024-08-16 NOTE — Telephone Encounter (Addendum)
 Client contacted [facility] staff asking about food at food market, told him it should be ready by 11am. He then asked if we could assist him in helping get his Losartan , Metformin  and glipizide  that he has been out for 2 days. Will assist once more, will again send information to [facility] that client is active until 06/03/25 for generics medications and to please send any qualifying medications to [facility] for filling. Will also discuss asking his provider for medications that are available as generics to be sent to [facility].   Avelina JONELLE Skeen RN [name]/[facility]

## 2024-09-26 ENCOUNTER — Ambulatory Visit: Admission: EM | Admit: 2024-09-26 | Discharge: 2024-09-26 | Disposition: A | Discharge status: Home / Self Care

## 2024-09-26 DIAGNOSIS — J069 Acute upper respiratory infection, unspecified: Secondary | ICD-10-CM | POA: Diagnosis not present

## 2024-09-26 LAB — POC SOFIA SARS ANTIGEN FIA: SARS Coronavirus 2 Ag: NEGATIVE

## 2024-09-26 LAB — POCT RAPID STREP A (OFFICE): Rapid Strep A Screen: NEGATIVE

## 2024-09-26 MED ORDER — LIDOCAINE VISCOUS HCL 2 % MT SOLN: 10.0000 mL | OROMUCOSAL | 0 refills | Status: AC | PRN

## 2024-09-26 MED ORDER — BENZONATATE 200 MG PO CAPS: 200.0000 mg | ORAL_CAPSULE | Freq: Three times a day (TID) | ORAL | 0 refills | Status: AC | PRN

## 2024-09-26 MED ORDER — AZELASTINE HCL 0.1 % NA SOLN: 1.0000 | Freq: Two times a day (BID) | NASAL | 0 refills | Status: AC

## 2024-09-26 NOTE — ED Provider Notes (Signed)
 [facility]    CSN: [id number] Arrival date & time: 09/26/24  1526      History   Chief Complaint Chief Complaint  Patient presents with   Cough    HPI Edward Roman is a 43 y.o. male.   Patient presenting today with 3-day history of congestion, cough, sore throat.  Denies fever, chills, chest pain, shortness of breath, abdominal pain, vomiting, diarrhea.  Daughter sick with similar symptoms.  So far tried cough drops with minimal relief.    Past Medical History:  Diagnosis Date   Diabetes mellitus without complication (HCC)    Hypertension     Patient Active Problem List   Diagnosis Date Noted   DKA (diabetic ketoacidosis) (HCC) 12/24/2023   SIRS (systemic inflammatory response syndrome) (HCC) 12/24/2023   DM (diabetes mellitus) (HCC) 07/10/2021   HTN (hypertension) 07/10/2021    History reviewed. No pertinent surgical history.     Home Medications    Prior to Admission medications   Medication Sig Start Date End Date Taking? Authorizing Provider  azelastine  (ASTELIN ) 0.1 % nasal spray Place 1 spray into both nostrils 2 (two) times daily. Use in each nostril as directed 09/26/24  Yes Stuart Vernell Norris, PA-C  benzonatate  (TESSALON ) 200 MG capsule Take 1 capsule (200 mg total) by mouth 3 (three) times daily as needed for cough. 09/26/24  Yes Stuart Vernell Norris, PA-C  glipiZIDE  (GLUCOTROL ) 10 MG tablet Take 10 mg by mouth every morning.   Yes [provider]  Insulin  Glargine (BASAGLAR  KWIKPEN) 100 UNIT/ML Inject 15 Units into the skin daily. May substitute as needed per insurance. 12/27/23  Yes Dezii, Alexandra, DO  JANUVIA  100 MG tablet Take 1 tablet (100 mg total) by mouth daily. 12/27/23  Yes Dezii, Alexandra, DO  lidocaine  (XYLOCAINE ) 2 % solution Use as directed 10 mLs in the mouth or throat every 3 (three) hours as needed. 09/26/24  Yes Stuart Vernell Norris, PA-C  losartan  (COZAAR ) 50 MG tablet Take 50 mg by mouth daily.   Yes  [provider]  metFORMIN  (GLUCOPHAGE ) 1000 MG tablet Take 1,000 mg by mouth 2 (two) times daily with a meal. 12/16/23  Yes [provider]  Blood Glucose Monitoring Suppl DEVI 1 each by Does not apply route 3 (three) times daily. May dispense any manufacturer covered by patient's insurance. 12/27/23   Dezii, Alexandra, DO  Glucagon , rDNA, (GLUCAGON  EMERGENCY) 1 MG KIT Inject 1 mg into the skin as needed for up to 2 doses (Severe low blood sugar). 12/27/23   Dezii, Alexandra, DO  Glucose Blood (BLOOD GLUCOSE TEST STRIPS) STRP 1 each by Does not apply route 3 (three) times daily. Use as directed to check blood sugar. May dispense any manufacturer covered by patient's insurance and fits patient's device. 12/27/23   Dezii, Alexandra, DO  Insulin  Pen Needle (PEN NEEDLES) 31G X 5 MM MISC 1 each by Does not apply route 3 (three) times daily. May dispense any manufacturer covered by patient's insurance. 12/27/23   Dezii, Alexandra, DO  Lancet Device MISC 1 each by Does not apply route 3 (three) times daily. May dispense any manufacturer covered by patient's insurance. 12/27/23   Dezii, Alexandra, DO  Lancets MISC 1 each by Does not apply route 3 (three) times daily. Use as directed to check blood sugar. May dispense any manufacturer covered by patient's insurance and fits patient's device. 12/27/23   Dezii, Alexandra, DO  hydrochlorothiazide  (HYDRODIURIL ) 25 MG tablet Take 1 tablet (25 mg total) by  mouth daily. Patient not taking: Reported on 11/24/2019 10/08/18 10/05/20  Armida Culver, PA-C    Family History Family History  Problem Relation Age of Onset   Hypertension Mother    Diabetes Mother    Cancer Father    Hypertension Father    Diabetes Father    Hypertension Other    Diabetes Other    Thyroid  disease Other     Social History Social History   Tobacco Use   Smoking status: Never   Smokeless tobacco: Never  Vaping Use   Vaping status: Never Used  Substance Use Topics    Alcohol  use: Yes    Comment: occassionally   Drug use: No     Allergies   Penicillin g   Review of Systems Review of Systems PER HPI  Physical Exam Triage Vital Signs ED Triage Vitals  Encounter Vitals Group     BP 09/26/24 1537 (!) 127/90     Girls Systolic BP Percentile --      Girls Diastolic BP Percentile --      Boys Systolic BP Percentile --      Boys Diastolic BP Percentile --      Pulse Rate 09/26/24 1537 90     Resp 09/26/24 1537 16     Temp 09/26/24 1537 98.2 F (36.8 C)     Temp Source 09/26/24 1537 Oral     SpO2 09/26/24 1537 93 %     Weight --      Height --      Head Circumference --      Peak Flow --      Pain Score 09/26/24 1538 5     Pain Loc --      Pain Education --      Exclude from Growth Chart --    No data found.  Updated Vital Signs BP (!) 127/90 (BP Location: Right Arm)   Pulse 90   Temp 98.2 F (36.8 C) (Oral)   Resp 16   SpO2 93%   Visual Acuity Right Eye Distance:   Left Eye Distance:   Bilateral Distance:    Right Eye Near:   Left Eye Near:    Bilateral Near:     Physical Exam Vitals and nursing note reviewed.  Constitutional:      Appearance: He is well-developed.  HENT:     Head: Atraumatic.     Right Ear: External ear normal.     Left Ear: External ear normal.     Nose: Rhinorrhea present.     Mouth/Throat:     Pharynx: Posterior oropharyngeal erythema present. No oropharyngeal exudate.  Eyes:     Conjunctiva/sclera: Conjunctivae normal.     Pupils: Pupils are equal, round, and reactive to light.  Cardiovascular:     Rate and Rhythm: Normal rate and regular rhythm.  Pulmonary:     Effort: Pulmonary effort is normal. No respiratory distress.     Breath sounds: No wheezing or rales.  Musculoskeletal:        General: Normal range of motion.     Cervical back: Normal range of motion and neck supple.  Lymphadenopathy:     Cervical: No cervical adenopathy.  Skin:    General: Skin is warm and dry.   Neurological:     Mental Status: He is alert and oriented to person, place, and time.  Psychiatric:        Behavior: Behavior normal.      UC Treatments / Results  Labs (  all labs ordered are listed, but only abnormal results are displayed) Labs Reviewed  POC SOFIA SARS ANTIGEN FIA  POCT RAPID STREP A (OFFICE)    EKG   Radiology No results found.  Procedures Procedures (including critical care time)  Medications Ordered in UC Medications - No data to display  Initial Impression / Assessment and Plan / UC Course  I have reviewed the triage vital signs and the nursing notes.  Pertinent labs & imaging results that were available during my care of the patient were reviewed by me and considered in my medical decision making (see chart for details).     Vitals and exam reassuring today, rapid strep and rapid COVID-negative.  Suspect viral respiratory infection.  Treat with Tessalon , viscous lidocaine , Astelin  nasal spray.  Discussed supportive over-the-counter medications, home care and return precautions.  Work note given.  Final Clinical Impressions(s) / UC Diagnoses   Final diagnoses:  Viral URI with cough   Discharge Instructions   None    ED Prescriptions     Medication Sig Dispense Auth. Provider   benzonatate  (TESSALON ) 200 MG capsule Take 1 capsule (200 mg total) by mouth 3 (three) times daily as needed for cough. 20 capsule Stuart Vernell Norris, PA-C   azelastine  (ASTELIN ) 0.1 % nasal spray Place 1 spray into both nostrils 2 (two) times daily. Use in each nostril as directed 30 mL Stuart Vernell Norris, PA-C   lidocaine  (XYLOCAINE ) 2 % solution Use as directed 10 mLs in the mouth or throat every 3 (three) hours as needed. 100 mL Stuart Vernell Norris, [place]      PDMP not reviewed this encounter.   Stuart Vernell Norris, PA-C 09/26/24 1623

## 2024-09-26 NOTE — ED Triage Notes (Signed)
 Cough, congestion, sore throat/neck x 3 days. Using cough drops.

## 2024-10-20 ENCOUNTER — Ambulatory Visit
Admission: EM | Admit: 2024-10-20 | Discharge: 2024-10-20 | Disposition: A | Discharge status: Home / Self Care | Attending: Nurse Practitioner | Admitting: Nurse Practitioner

## 2024-10-20 ENCOUNTER — Other Ambulatory Visit: Payer: Self-pay

## 2024-10-20 ENCOUNTER — Encounter: Payer: Self-pay | Admitting: Emergency Medicine

## 2024-10-20 DIAGNOSIS — R0982 Postnasal drip: Secondary | ICD-10-CM

## 2024-10-20 DIAGNOSIS — K0889 Other specified disorders of teeth and supporting structures: Secondary | ICD-10-CM

## 2024-10-20 DIAGNOSIS — K047 Periapical abscess without sinus: Secondary | ICD-10-CM

## 2024-10-20 MED ORDER — CLINDAMYCIN HCL 150 MG PO CAPS: 450.0000 mg | ORAL_CAPSULE | Freq: Three times a day (TID) | ORAL | 0 refills | Status: AC

## 2024-10-20 MED ORDER — KETOROLAC TROMETHAMINE 30 MG/ML IJ SOLN
30.0000 mg | Freq: Once | INTRAMUSCULAR | Status: AC
  Administered 2024-10-20: 17:00:00 30 mg via INTRAMUSCULAR

## 2024-10-20 NOTE — Discharge Instructions (Addendum)
 You were given an injection of Toradol  30 mg.  Do not take any additional NSAIDs today to include ibuprofen , Aleve, Motrin , naproxen, or Advil .  You may take Tylenol  for breakthrough pain or discomfort. Take medication as prescribed.  As discussed, recommend eating yogurt daily while taking the medication or beginning a probiotic while you are taking the medication. Warm salt water gargles 3-4 times daily as needed for mouth pain or discomfort. Apply warm compresses to the affected area as needed for pain or discomfort.  You may apply cool compresses for pain or swelling. Recommend a soft diet until symptoms improve. Go to the emergency department if you experience worsening dental pain, facial swelling, or new symptoms of fever, chills, or other concerns. Recommend follow-up with a dentist within the next 7 to 10 days for reevaluation.  For the postnasal drainage: Begin the azelastine  nasal spray previously prescribed. You may also begin an over-the-counter antihistamine such as Zyrtec , Claritin, or Allegra. Warm salt water gargles 3-4 times daily as needed for throat pain or discomfort. You may use over-the-counter Chloraseptic throat spray or throat lozenges while symptoms persist. If symptoms fail to improve with this treatment, you may follow-up in this clinic or with your primary care physician.  Follow-up as needed.

## 2024-10-20 NOTE — ED Triage Notes (Signed)
 Pt reports left sided dental pain for last week. Pt also reports continued sore throat for last several weeks. Denies any known fevers. Pt reports has tried ibuprofen , mucinex , and oral px with minimal changes in symptoms.

## 2024-10-20 NOTE — ED Provider Notes (Signed)
 [facility]    CSN: [id number] Arrival date & time: 10/20/24  1637      History   Chief Complaint Chief Complaint  Patient presents with   Oral Pain    HPI ZAKRY CASO is a 43 y.o. male.   The history is provided by the patient.   Patient presents for complaints of dental pain that has been present for the past several days.  He reports swelling to the left side of the lower jaw.  He also endorses pain with eating and talking.  Patient states that he has a tooth that has been broken for approximately 1 month.  Rates dental pain 8/10 at present.  He denies fever, chills, headache, ear pain, nasal congestion, runny nose, and cough.  Patient does endorse continued sore throat.  He states that he feels like he has a lot of drainage in his throat at night.  States throat pain is worse at night.  Patient was seen on 09/26/2024 and prescribed azelastine , viscous lidocaine , and benzonatate .  States that he has not started the azelastine  nasal spray.  Past Medical History:  Diagnosis Date   Diabetes mellitus without complication (HCC)    Hypertension     Patient Active Problem List   Diagnosis Date Noted   DKA (diabetic ketoacidosis) (HCC) 12/24/2023   SIRS (systemic inflammatory response syndrome) (HCC) 12/24/2023   DM (diabetes mellitus) (HCC) 07/10/2021   HTN (hypertension) 07/10/2021    History reviewed. No pertinent surgical history.     Home Medications    Prior to Admission medications  Medication Sig Start Date End Date Taking? Authorizing Provider  azelastine  (ASTELIN ) 0.1 % nasal spray Place 1 spray into both nostrils 2 (two) times daily. Use in each nostril as directed 09/26/24   Stuart Vernell Norris, PA-C  benzonatate  (TESSALON ) 200 MG capsule Take 1 capsule (200 mg total) by mouth 3 (three) times daily as needed for cough. 09/26/24   Stuart Vernell Norris, PA-C  Blood Glucose Monitoring Suppl DEVI 1 each by Does not apply route 3 (three)  times daily. May dispense any manufacturer covered by patient's insurance. 12/27/23   Dezii, Alexandra, DO  glipiZIDE  (GLUCOTROL ) 10 MG tablet Take 10 mg by mouth every morning.    [provider]  Glucagon , rDNA, (GLUCAGON  EMERGENCY) 1 MG KIT Inject 1 mg into the skin as needed for up to 2 doses (Severe low blood sugar). 12/27/23   Dezii, Alexandra, DO  Glucose Blood (BLOOD GLUCOSE TEST STRIPS) STRP 1 each by Does not apply route 3 (three) times daily. Use as directed to check blood sugar. May dispense any manufacturer covered by patient's insurance and fits patient's device. 12/27/23   Dezii, Alexandra, DO  Insulin  Glargine (BASAGLAR  KWIKPEN) 100 UNIT/ML Inject 15 Units into the skin daily. May substitute as needed per insurance. 12/27/23   Dezii, Alexandra, DO  Insulin  Pen Needle (PEN NEEDLES) 31G X 5 MM MISC 1 each by Does not apply route 3 (three) times daily. May dispense any manufacturer covered by patient's insurance. 12/27/23   Dezii, Alexandra, DO  JANUVIA  100 MG tablet Take 1 tablet (100 mg total) by mouth daily. 12/27/23   Dezii, Alexandra, DO  Lancet Device MISC 1 each by Does not apply route 3 (three) times daily. May dispense any manufacturer covered by patient's insurance. 12/27/23   Dezii, Alexandra, DO  Lancets MISC 1 each by Does not apply route 3 (three) times daily. Use as directed to check blood sugar. May dispense any  manufacturer covered by [employer] and fits patient's device. 12/27/23   Dezii, Alexandra, DO  lidocaine  (XYLOCAINE ) 2 % solution Use as directed 10 mLs in the mouth or throat every 3 (three) hours as needed. 09/26/24   Stuart Vernell Norris, PA-C  losartan  (COZAAR ) 50 MG tablet Take 50 mg by mouth daily.    [provider]  metFORMIN  (GLUCOPHAGE ) 1000 MG tablet Take 1,000 mg by mouth 2 (two) times daily with a meal. 12/16/23   [provider]  hydrochlorothiazide  (HYDRODIURIL ) 25 MG tablet Take 1 tablet (25 mg total) by mouth  daily. Patient not taking: Reported on 11/24/2019 10/08/18 10/05/20  Armida Culver, PA-C    Family History Family History  Problem Relation Age of Onset   Hypertension Mother    Diabetes Mother    Cancer Father    Hypertension Father    Diabetes Father    Hypertension Other    Diabetes Other    Thyroid  disease Other     Social History Social History[1]   Allergies   Penicillin g   Review of Systems Review of Systems Per HPI  Physical Exam Triage Vital Signs ED Triage Vitals  Encounter Vitals Group     BP 10/20/24 1702 (!) 141/99     Girls Systolic BP Percentile --      Girls Diastolic BP Percentile --      Boys Systolic BP Percentile --      Boys Diastolic BP Percentile --      Pulse Rate 10/20/24 1702 85     Resp 10/20/24 1702 18     Temp 10/20/24 1702 98.1 F (36.7 C)     Temp Source 10/20/24 1702 Oral     SpO2 10/20/24 1702 96 %     Weight --      Height --      Head Circumference --      Peak Flow --      Pain Score 10/20/24 1700 8     Pain Loc --      Pain Education --      Exclude from Growth Chart --    No data found.  Updated Vital Signs BP (!) 141/99 (BP Location: Right Arm)   Pulse 85   Temp 98.1 F (36.7 C) (Oral)   Resp 18   SpO2 96%   Visual Acuity Right Eye Distance:   Left Eye Distance:   Bilateral Distance:    Right Eye Near:   Left Eye Near:    Bilateral Near:     Physical Exam Vitals and nursing note reviewed.  Constitutional:      General: He is not in acute distress.    Appearance: Normal appearance.  HENT:     Head: Normocephalic.     Jaw: Tenderness, swelling (Left lower jaw) and pain on movement present.     Salivary Glands: Right salivary gland is not diffusely enlarged or tender. Left salivary gland is not diffusely enlarged or tender.     Right Ear: Tympanic membrane, ear canal and external ear normal.     Left Ear: Tympanic membrane, ear canal and external ear normal.     Nose: Nose normal.     Right  Turbinates: Enlarged and swollen.     Left Turbinates: Enlarged and swollen.     Right Sinus: No maxillary sinus tenderness or frontal sinus tenderness.     Left Sinus: No maxillary sinus tenderness or frontal sinus tenderness.     Mouth/Throat:  Lips: Pink.     Mouth: Mucous membranes are moist.     Dentition: Abnormal dentition. Dental tenderness, gingival swelling and dental abscesses present.     Pharynx: Uvula midline. Posterior oropharyngeal erythema and postnasal drip present. No pharyngeal swelling, oropharyngeal exudate or uvula swelling.      Comments: Cobblestoning present to posterior oropharynx  Eyes:     Extraocular Movements: Extraocular movements intact.     Conjunctiva/sclera: Conjunctivae normal.     Pupils: Pupils are equal, round, and reactive to light.  Cardiovascular:     Rate and Rhythm: Normal rate and regular rhythm.     Pulses: Normal pulses.     Heart sounds: Normal heart sounds.  Pulmonary:     Effort: Pulmonary effort is normal. No respiratory distress.     Breath sounds: Normal breath sounds. No stridor. No wheezing, rhonchi or rales.  Musculoskeletal:     Cervical back: Normal range of motion.  Skin:    General: Skin is warm and dry.  Neurological:     General: No focal deficit present.     Mental Status: He is alert and oriented to person, place, and time.  Psychiatric:        Mood and Affect: Mood normal.        Behavior: Behavior normal.      UC Treatments / Results  Labs (all labs ordered are listed, but only abnormal results are displayed) Labs Reviewed - No data to display  EKG   Radiology No results found.  Procedures Procedures (including critical care time)  Medications Ordered in UC Medications  ketorolac  (TORADOL ) 30 MG/ML injection 30 mg (has no administration in time range)    Initial Impression / Assessment and Plan / UC Course  I have reviewed the triage vital signs and the nursing notes.  Pertinent labs &  imaging results that were available during my care of the patient were reviewed by me and considered in my medical decision making (see chart for details).  On exam, patient with swelling to the left lower jaw.  He also has a fractured tooth in the same or similar area.  Symptoms are consistent with dental abscess and dentalgia.  Toradol  30 mg IM administered for pain. Treat with clindamycin  450 mg 3 times daily for 7 days.  With regard to his ongoing throat pain, patient with moderate postnasal drainage, this most likely is causing his throat pain, particularly at nighttime.  Patient advised to begin the azelastine  nasal spray previously prescribed, also recommend over-the-counter antihistamine.  Supportive care recommendations were provided and discussed with the patient to include over-the-counter analgesics, fluids, rest, warm salt water gargles, warm or cool compresses to the face, and to monitor for worsening symptoms.  Patient was provided a writer guide, recommend follow-up with dentist within the next 7 to 10 days.  Patient was in agreement with this plan of care and verbalizes understanding.  All questions were answered.  Patient stable for discharge.  Final Clinical Impressions(s) / UC Diagnoses   Final diagnoses:  None   Discharge Instructions   None    ED Prescriptions   None    PDMP not reviewed this encounter.    [1]  Social History Tobacco Use   Smoking status: Never   Smokeless tobacco: Never  Vaping Use   Vaping status: Never Used  Substance Use Topics   Alcohol  use: Yes    Comment: occassionally   Drug use: No     Leath-Warren, Etta PARAS,  NP 10/20/24 1737

## 2024-11-08 ENCOUNTER — Encounter (HOSPITAL_COMMUNITY): Payer: Self-pay | Admitting: Emergency Medicine

## 2024-11-08 ENCOUNTER — Emergency Department (HOSPITAL_COMMUNITY)

## 2024-11-08 ENCOUNTER — Other Ambulatory Visit: Payer: Self-pay

## 2024-11-08 ENCOUNTER — Emergency Department (HOSPITAL_COMMUNITY)
Admission: EM | Admit: 2024-11-08 | Discharge: 2024-11-08 | Disposition: A | Discharge status: Home / Self Care | Attending: Emergency Medicine | Admitting: Emergency Medicine

## 2024-11-08 DIAGNOSIS — E876 Hypokalemia: Secondary | ICD-10-CM | POA: Insufficient documentation

## 2024-11-08 DIAGNOSIS — I10 Essential (primary) hypertension: Secondary | ICD-10-CM | POA: Insufficient documentation

## 2024-11-08 DIAGNOSIS — Z7984 Long term (current) use of oral hypoglycemic drugs: Secondary | ICD-10-CM | POA: Insufficient documentation

## 2024-11-08 DIAGNOSIS — Z79899 Other long term (current) drug therapy: Secondary | ICD-10-CM | POA: Insufficient documentation

## 2024-11-08 DIAGNOSIS — Z794 Long term (current) use of insulin: Secondary | ICD-10-CM | POA: Diagnosis not present

## 2024-11-08 DIAGNOSIS — E1165 Type 2 diabetes mellitus with hyperglycemia: Secondary | ICD-10-CM | POA: Diagnosis not present

## 2024-11-08 DIAGNOSIS — R1013 Epigastric pain: Secondary | ICD-10-CM | POA: Insufficient documentation

## 2024-11-08 LAB — TROPONIN T, HIGH SENSITIVITY
Troponin T High Sensitivity: <15 ng/L (ref 0–19)
Troponin T High Sensitivity: <15 ng/L (ref 0–19)

## 2024-11-08 LAB — BASIC METABOLIC PANEL WITH GFR
Anion gap: 13 (ref 5–15)
BUN: 10 mg/dL (ref 6–20)
CO2: 28 mmol/L (ref 22–32)
Calcium: 9.6 mg/dL (ref 8.9–10.3)
Chloride: 97 mmol/L — ABNORMAL LOW (ref 98–111)
Creatinine, Ser: 1.01 mg/dL (ref 0.61–1.24)
GFR, Estimated: >60 mL/min
Glucose, Bld: 327 mg/dL — ABNORMAL HIGH (ref 70–99)
Potassium: 3.3 mmol/L — ABNORMAL LOW (ref 3.5–5.1)
Sodium: 137 mmol/L (ref 135–145)

## 2024-11-08 LAB — HEPATIC FUNCTION PANEL
ALT: 9 U/L (ref 0–44)
AST: 12 U/L — ABNORMAL LOW (ref 15–41)
Albumin: 4.8 g/dL (ref 3.5–5.0)
Alkaline Phosphatase: 85 U/L (ref 38–126)
Bilirubin, Direct: 0.3 mg/dL — ABNORMAL HIGH (ref 0.0–0.2)
Indirect Bilirubin: 0.4 mg/dL (ref 0.3–0.9)
Total Bilirubin: 0.7 mg/dL (ref 0.0–1.2)
Total Protein: 7.7 g/dL (ref 6.5–8.1)

## 2024-11-08 LAB — CBC
HCT: 38.6 % — ABNORMAL LOW (ref 39.0–52.0)
Hemoglobin: 13.8 g/dL (ref 13.0–17.0)
MCH: 30.3 pg (ref 26.0–34.0)
MCHC: 35.8 g/dL (ref 30.0–36.0)
MCV: 84.6 fL (ref 80.0–100.0)
Platelets: 363 K/uL (ref 150–400)
RBC: 4.56 MIL/uL (ref 4.22–5.81)
RDW: 11.9 % (ref 11.5–15.5)
WBC: 8.5 K/uL (ref 4.0–10.5)
nRBC: 0 % (ref 0.0–0.2)

## 2024-11-08 LAB — LIPASE, BLOOD: Lipase: 25 U/L (ref 11–51)

## 2024-11-08 MED ORDER — POTASSIUM CHLORIDE CRYS ER 20 MEQ PO TBCR: 20.0000 meq | EXTENDED_RELEASE_TABLET | Freq: Two times a day (BID) | ORAL | 0 refills | Status: AC

## 2024-11-08 MED ORDER — ONDANSETRON HCL 4 MG/2ML IJ SOLN
4.0000 mg | Freq: Once | INTRAMUSCULAR | Status: AC
  Administered 2024-11-08: 4 mg via INTRAVENOUS
  Filled 2024-11-08: qty 2

## 2024-11-08 MED ORDER — POTASSIUM CHLORIDE CRYS ER 20 MEQ PO TBCR
40.0000 meq | EXTENDED_RELEASE_TABLET | Freq: Once | ORAL | Status: AC
  Administered 2024-11-08: 40 meq via ORAL
  Filled 2024-11-08: qty 2

## 2024-11-08 MED ORDER — ALUM & MAG HYDROXIDE-SIMETH 200-200-20 MG/5ML PO SUSP
30.0000 mL | Freq: Once | ORAL | Status: AC
  Administered 2024-11-08: 30 mL via ORAL
  Filled 2024-11-08: qty 30

## 2024-11-08 MED ORDER — PANTOPRAZOLE SODIUM 40 MG IV SOLR
40.0000 mg | Freq: Once | INTRAVENOUS | Status: AC
  Administered 2024-11-08: 40 mg via INTRAVENOUS
  Filled 2024-11-08: qty 10

## 2024-11-08 MED ORDER — ASPIRIN 81 MG PO CHEW
324.0000 mg | CHEWABLE_TABLET | Freq: Once | ORAL | Status: AC
  Administered 2024-11-08: 324 mg via ORAL
  Filled 2024-11-08: qty 4

## 2024-11-08 MED ORDER — PANTOPRAZOLE SODIUM 40 MG PO TBEC: 40.0000 mg | DELAYED_RELEASE_TABLET | Freq: Every day | ORAL | 0 refills | Status: AC

## 2024-11-08 NOTE — ED Triage Notes (Signed)
 Pt arrives to ED via POV c/o epigastric pressure for the past hour. Pt states he was just laying in bed when the it started. Denies hx of same.

## 2024-11-08 NOTE — Discharge Instructions (Signed)
 You may take antacids such as Maalox, Mylanta, Pepto-Bismol as needed.  Return to the emergency department if symptoms are getting worse.

## 2024-11-08 NOTE — ED Provider Notes (Signed)
 " [facility] AT [facility] Provider Note   CSN: [id number] Arrival date & time: 11/08/24  9570     Patient presents with: Chest Pain   Edward Roman is a 44 y.o. male.   The history is provided by the patient.  Chest Pain  He has a history of hypertension, diabetes, hyperlipidemia and comes in complaining of epigastric pain which started about 2 hours ago and woke him up.  There has been some dyspnea as well as nausea and vomiting.  He denies diaphoresis.  He states he feels like he has to belch but cannot get the belch up.  He has never had anything like this before.  Nothing makes the pain better, nothing makes it worse.  He is a non-smoker and nondrinker, denies family history of premature coronary atherosclerosis.    Prior to Admission medications  Medication Sig Start Date End Date Taking? Authorizing Provider  azelastine  (ASTELIN ) 0.1 % nasal spray Place 1 spray into both nostrils 2 (two) times daily. Use in each nostril as directed 09/26/24   Stuart Vernell Norris, PA-C  benzonatate  (TESSALON ) 200 MG capsule Take 1 capsule (200 mg total) by mouth 3 (three) times daily as needed for cough. 09/26/24   Stuart Vernell Norris, PA-C  Blood Glucose Monitoring Suppl DEVI 1 each by Does not apply route 3 (three) times daily. May dispense any manufacturer covered by patient's insurance. 12/27/23   Dezii, Alexandra, DO  glipiZIDE  (GLUCOTROL ) 10 MG tablet Take 10 mg by mouth every morning.    [provider]  Glucagon , rDNA, (GLUCAGON  EMERGENCY) 1 MG KIT Inject 1 mg into the skin as needed for up to 2 doses (Severe low blood sugar). 12/27/23   Dezii, Alexandra, DO  Glucose Blood (BLOOD GLUCOSE TEST STRIPS) STRP 1 each by Does not apply route 3 (three) times daily. Use as directed to check blood sugar. May dispense any manufacturer covered by patient's insurance and fits patient's device. 12/27/23   Dezii, Alexandra, DO  Insulin  Glargine (BASAGLAR   KWIKPEN) 100 UNIT/ML Inject 15 Units into the skin daily. May substitute as needed per insurance. 12/27/23   Dezii, Alexandra, DO  Insulin  Pen Needle (PEN NEEDLES) 31G X 5 MM MISC 1 each by Does not apply route 3 (three) times daily. May dispense any manufacturer covered by patient's insurance. 12/27/23   Dezii, Alexandra, DO  JANUVIA  100 MG tablet Take 1 tablet (100 mg total) by mouth daily. 12/27/23   Dezii, Alexandra, DO  Lancet Device MISC 1 each by Does not apply route 3 (three) times daily. May dispense any manufacturer covered by patient's insurance. 12/27/23   Dezii, Alexandra, DO  Lancets MISC 1 each by Does not apply route 3 (three) times daily. Use as directed to check blood sugar. May dispense any manufacturer covered by patient's insurance and fits patient's device. 12/27/23   Dezii, Alexandra, DO  lidocaine  (XYLOCAINE ) 2 % solution Use as directed 10 mLs in the mouth or throat every 3 (three) hours as needed. 09/26/24   Stuart Vernell Norris, PA-C  losartan  (COZAAR ) 50 MG tablet Take 50 mg by mouth daily.    [provider]  metFORMIN  (GLUCOPHAGE ) 1000 MG tablet Take 1,000 mg by mouth 2 (two) times daily with a meal. 12/16/23   [provider]  hydrochlorothiazide  (HYDRODIURIL ) 25 MG tablet Take 1 tablet (25 mg total) by mouth daily. Patient not taking: Reported on 11/24/2019 10/08/18 10/05/20  Armida Culver, PA-C    Allergies: Penicillin g  Review of Systems  Cardiovascular:  Positive for chest pain.  All other systems reviewed and are negative.   Updated Vital Signs BP (!) 166/115 (BP Location: Left Arm)   Pulse (!) 114   Temp 97.8 F (36.6 C) (Oral)   Resp 16   Ht 5' 6 (1.676 m)   Wt 80.7 kg   SpO2 97%   BMI 28.72 kg/m   Physical Exam Vitals and nursing note reviewed.   44 year old male, resting comfortably and in no acute distress. Vital signs are significant for elevated heart rate and blood pressure. Oxygen saturation is 97%, which is normal. Head  is normocephalic and atraumatic. PERRLA, EOMI. Oropharynx is clear. Neck is nontender and supple without adenopathy. Lungs are clear without rales, wheezes, or rhonchi. Chest is nontender. Heart has regular rate and rhythm without murmur. Abdomen is soft, flat, nontender. Extremities have no cyanosis or edema, full range of motion is present. Skin is warm and dry without rash. Neurologic: Mental status is normal, cranial nerves are intact, moves all extremities equally.  (all labs ordered are listed, but only abnormal results are displayed) Labs Reviewed  BASIC METABOLIC PANEL WITH GFR - Abnormal; Notable for the following components:      Result Value   Potassium 3.3 (*)    Chloride 97 (*)    Glucose, Bld 327 (*)    All other components within normal limits  CBC - Abnormal; Notable for the following components:   HCT 38.6 (*)    All other components within normal limits  HEPATIC FUNCTION PANEL - Abnormal; Notable for the following components:   AST 12 (*)    Bilirubin, Direct 0.3 (*)    All other components within normal limits  LIPASE, BLOOD  TROPONIN T, HIGH SENSITIVITY  TROPONIN T, HIGH SENSITIVITY    EKG: EKG Interpretation Date/Time:  Tuesday November 08 2024 04:45:58 EST Ventricular Rate:  109 PR Interval:  180 QRS Duration:  81 QT Interval:  314 QTC Calculation: 423 R Axis:   75  Text Interpretation: Sinus tachycardia Baseline wander in lead(s) II III aVR aVF When compared with ECG of 12/24/2023, No significant change was found Confirmed by Raford Lenis ([id number]) on 11/08/2024 5:00:12 AM  Radiology: DG Chest 2 View Result Date: 11/08/2024 EXAM: 2 VIEW(S) XRAY OF THE CHEST 11/08/2024 05:11:00 AM COMPARISON: AP and lateral chest 12/24/2023. CLINICAL HISTORY: chest pain FINDINGS: LINES, TUBES AND DEVICES: Multiple overlying telemetry leads. LUNGS AND PLEURA: Mild chronic elevation of the right hemidiaphragm. No focal pulmonary opacity. No pleural effusion. No pneumothorax.  HEART AND MEDIASTINUM: No acute abnormality of the cardiac and mediastinal silhouettes. BONES AND SOFT TISSUES: No acute osseous abnormality. IMPRESSION: 1. No acute findings. Electronically signed by: Francis Quam MD 11/08/2024 05:29 AM EST RP Workstation: [id number]    Cardiac monitor shows sinus tachycardia, per my interpretation.  Procedures   Medications Ordered in the ED  ondansetron  (ZOFRAN ) injection 4 mg (4 mg Intravenous Given 11/08/24 0527)  aspirin  chewable tablet 324 mg (324 mg Oral Given 11/08/24 0527)  pantoprazole  (PROTONIX ) injection 40 mg (40 mg Intravenous Given 11/08/24 0527)  potassium chloride  SA (KLOR-CON  M) CR tablet 40 mEq (40 mEq Oral Given 11/08/24 0619)  alum & mag hydroxide-simeth (MAALOX/MYLANTA) 200-200-20 MG/5ML suspension 30 mL (30 mLs Oral Given 11/08/24 9378)  Medical Decision Making Amount and/or Complexity of Data Reviewed Labs: ordered. Radiology: ordered.  Risk OTC drugs. Prescription drug management.   Epigastric discomfort.  This is a presentation with a wide range of treatment options and carries with a high risk of morbidity and complications.  Differential diagnosis includes, but is not limited to, GERD, cholecystitis, pancreatitis, peptic ulcer disease, ACS, esophageal spasm.  I have reviewed his electrocardiogram, and my interpretation is sinus tachycardia without ST or T changes, unchanged from prior.  I have ordered laboratory workup, intravenous ondansetron , oral aspirin .  I have reviewed his past records, and see no relevant past visits.  I have reviewed his laboratory tests, my interpretation is normal CBC, mild hypokalemia, moderately elevated glucose, normal hepatic function panel, normal lipase, normal troponin.  Chest x-ray shows no acute cardiopulmonary process.  I have independently viewed the images, and agree with radiologist's interpretation.  He has noted some improvement with above-noted treatment, I  have ordered a dose of an antacid while waiting for repeat troponin.  Repeat troponin is also normal.  I am discharging him with prescription for pantoprazole  and potassium.  Follow-up with PCP, return precautions discussed.     Final diagnoses:  Epigastric pain  Hypokalemia    ED Discharge Orders          Ordered    pantoprazole  (PROTONIX ) 40 MG tablet  Daily        11/08/24 0718    potassium chloride  SA (KLOR-CON  M) 20 MEQ tablet  2 times daily        11/08/24 9281               Raford Lenis, MD 11/08/24 0720  "
